# Patient Record
Sex: Female | Born: 1995 | Race: Black or African American | Hispanic: No | Marital: Single | Smoking: Never smoker
Health system: Southern US, Community
[De-identification: ages and names within clinical notes are randomized; demographics above are authoritative.]

## PROBLEM LIST (undated history)

## (undated) DIAGNOSIS — L309 Dermatitis, unspecified: Secondary | ICD-10-CM

## (undated) DIAGNOSIS — T7840XA Allergy, unspecified, initial encounter: Secondary | ICD-10-CM

## (undated) HISTORY — DX: Allergy, unspecified, initial encounter: T78.40XA

## (undated) HISTORY — PX: OTHER SURGICAL HISTORY: SHX169

## (undated) HISTORY — PX: BREAST SURGERY: SHX581

---
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
## 2015-09-21 LAB — OB RESULTS CONSOLE HEPATITIS B SURFACE ANTIGEN: Hepatitis B Surface Ag: NEGATIVE

## 2015-09-21 LAB — OB RESULTS CONSOLE ABO/RH: RH TYPE: POSITIVE

## 2015-09-21 LAB — OB RESULTS CONSOLE GC/CHLAMYDIA
Chlamydia: NEGATIVE
Gonorrhea: NEGATIVE

## 2015-09-21 LAB — OB RESULTS CONSOLE HIV ANTIBODY (ROUTINE TESTING): HIV: NONREACTIVE

## 2015-09-21 LAB — OB RESULTS CONSOLE RUBELLA ANTIBODY, IGM: RUBELLA: NON-IMMUNE/NOT IMMUNE

## 2015-09-21 LAB — OB RESULTS CONSOLE RPR: RPR: NONREACTIVE

## 2015-09-21 LAB — OB RESULTS CONSOLE ANTIBODY SCREEN: ANTIBODY SCREEN: NEGATIVE

## 2015-11-15 ENCOUNTER — Ambulatory Visit (INDEPENDENT_AMBULATORY_CARE_PROVIDER_SITE_OTHER): Payer: Medicaid Other | Admitting: Obstetrics & Gynecology

## 2015-11-15 ENCOUNTER — Encounter: Payer: Self-pay | Admitting: Obstetrics & Gynecology

## 2015-11-15 VITALS — BP 111/77 | HR 101 | Temp 98.4°F | Wt 156.4 lb

## 2015-11-15 DIAGNOSIS — Z3402 Encounter for supervision of normal first pregnancy, second trimester: Secondary | ICD-10-CM

## 2015-11-15 DIAGNOSIS — Z3490 Encounter for supervision of normal pregnancy, unspecified, unspecified trimester: Secondary | ICD-10-CM | POA: Insufficient documentation

## 2015-11-15 MED ORDER — PREPLUS 27-1 MG PO TABS: 1.0000 | ORAL_TABLET | Freq: Every day | ORAL | 13 refills | Status: DC

## 2015-11-15 NOTE — Patient Instructions (Signed)
Thank you for enrolling in MyChart. Please follow the instructions below to securely access your online medical record. MyChart allows you to send messages to your doctor, view your test results, manage appointments, and more.   How Do I Sign Up? 1. In your Internet browser, go to [employer] and enter [email or web address]. 2. Click on the Sign Up Now link in the Sign In box. You will see the New Member Sign Up page. 3. Enter your MyChart Access Code exactly as it appears below. You will not need to use this code after you've completed the sign-up process. If you do not sign up before the expiration date, you must request a new code.  MyChart Access Code: [email or web address] Expires: 01/14/2016  2:32 PM  4. Enter your Social Security Number ([email or web address]) and Date of Birth (mm/dd/yyyy) as indicated and click Submit. You will be taken to the next sign-up page. 5. Create a MyChart ID. This will be your MyChart login ID and cannot be changed, so think of one that is secure and easy to remember. 6. Create a MyChart password. You can change your password at any time. 7. Enter your Password Reset Question and Answer. This can be used at a later time if you forget your password.  8. Enter your e-mail address. You will receive e-mail notification when new information is available in MyChart. 9. Click Sign Up. You can now view your medical record.   Additional Information Remember, MyChart is NOT to be used for urgent needs. For medical emergencies, dial 911.    Second Trimester of Pregnancy The second trimester is from week 13 through week 28, months 4 through 6. The second trimester is often a time when you feel your best. Your body has also adjusted to being pregnant, and you begin to feel better physically. Usually, morning sickness has lessened or quit completely, you may have more energy, and you may have an increase in appetite. The second trimester is also a time when the fetus is  growing rapidly. At the end of the sixth month, the fetus is about 9 inches long and weighs about 1 pounds. You will likely begin to feel the baby move (quickening) between 18 and 20 weeks of the pregnancy. BODY CHANGES Your body goes through many changes during pregnancy. The changes vary from woman to woman.   Your weight will continue to increase. You will notice your lower abdomen bulging out.  You may begin to get stretch marks on your hips, abdomen, and breasts.  You may develop headaches that can be relieved by medicines approved by your health care provider.  You may urinate more often because the fetus is pressing on your bladder.  You may develop or continue to have heartburn as a result of your pregnancy.  You may develop constipation because certain hormones are causing the muscles that push waste through your intestines to slow down.  You may develop hemorrhoids or swollen, bulging veins (varicose veins).  You may have back pain because of the weight gain and pregnancy hormones relaxing your joints between the bones in your pelvis and as a result of a shift in weight and the muscles that support your balance.  Your breasts will continue to grow and be tender.  Your gums may bleed and may be sensitive to brushing and flossing.  Dark spots or blotches (chloasma, mask of pregnancy) may develop on your face. This will likely fade after the baby is born.  A dark line from your  belly button to the pubic area (linea nigra) may appear. This will likely fade after the baby is born.  You may have changes in your hair. These can include thickening of your hair, rapid growth, and changes in texture. Some women also have hair loss during or after pregnancy, or hair that feels dry or thin. Your hair will most likely return to normal after your baby is born. WHAT TO EXPECT AT YOUR PRENATAL VISITS During a routine prenatal visit:  You will be weighed to make sure you and the fetus are  growing normally.  Your blood pressure will be taken.  Your abdomen will be measured to track your baby's growth.  The fetal heartbeat will be listened to.  Any test results from the previous visit will be discussed. Your health care provider may ask you:  How you are feeling.  If you are feeling the baby move.  If you have had any abnormal symptoms, such as leaking fluid, bleeding, severe headaches, or abdominal cramping.  If you are using any tobacco products, including cigarettes, chewing tobacco, and electronic cigarettes.  If you have any questions. Other tests that may be performed during your second trimester include:  Blood tests that check for:  Low iron levels (anemia).  Gestational diabetes (between 24 and 28 weeks).  Rh antibodies.  Urine tests to check for infections, diabetes, or protein in the urine.  An ultrasound to confirm the proper growth and development of the baby.  An amniocentesis to check for possible genetic problems.  Fetal screens for spina bifida and Down syndrome.  HIV (human immunodeficiency virus) testing. Routine prenatal testing includes screening for HIV, unless you choose not to have this test. HOME CARE INSTRUCTIONS   Avoid all smoking, herbs, alcohol, and unprescribed drugs. These chemicals affect the formation and growth of the baby.  Do not use any tobacco products, including cigarettes, chewing tobacco, and electronic cigarettes. If you need help quitting, ask your health care provider. You may receive counseling support and other resources to help you quit.  Follow your health care provider's instructions regarding medicine use. There are medicines that are either safe or unsafe to take during pregnancy.  Exercise only as directed by your health care provider. Experiencing uterine cramps is a good sign to stop exercising.  Continue to eat regular, healthy meals.  Wear a good support bra for breast tenderness.  Do not use  hot tubs, steam rooms, or saunas.  Wear your seat belt at all times when driving.  Avoid raw meat, uncooked cheese, cat litter boxes, and soil used by cats. These carry germs that can cause birth defects in the baby.  Take your prenatal vitamins.  Take 1500-2000 mg of calcium daily starting at the 20th week of pregnancy until you deliver your baby.  Try taking a stool softener (if your health care provider approves) if you develop constipation. Eat more high-fiber foods, such as fresh vegetables or fruit and whole grains. Drink plenty of fluids to keep your urine clear or pale yellow.  Take warm sitz baths to soothe any pain or discomfort caused by hemorrhoids. Use hemorrhoid cream if your health care provider approves.  If you develop varicose veins, wear support hose. Elevate your feet for 15 minutes, 3-4 times a day. Limit salt in your diet.  Avoid heavy lifting, wear low heel shoes, and practice good posture.  Rest with your legs elevated if you have leg cramps or low back pain.  Visit your dentist if  you have not gone yet during your pregnancy. Use a soft toothbrush to brush your teeth and be gentle when you floss.  A sexual relationship may be continued unless your health care provider directs you otherwise.  Continue to go to all your prenatal visits as directed by your health care provider. SEEK MEDICAL CARE IF:   You have dizziness.  You have mild pelvic cramps, pelvic pressure, or nagging pain in the abdominal area.  You have persistent nausea, vomiting, or diarrhea.  You have a bad smelling vaginal discharge.  You have pain with urination. SEEK IMMEDIATE MEDICAL CARE IF:   You have a fever.  You are leaking fluid from your vagina.  You have spotting or bleeding from your vagina.  You have severe abdominal cramping or pain.  You have rapid weight gain or loss.  You have shortness of breath with chest pain.  You notice sudden or extreme swelling of your  face, hands, ankles, feet, or legs.  You have not felt your baby move in over an hour.  You have severe headaches that do not go away with medicine.  You have vision changes.   This information is not intended to replace advice given to you by your health care provider. Make sure you discuss any questions you have with your health care provider.   Document Released: 01/07/2001 Document Revised: 02/03/2014 Document Reviewed: 03/16/2012 Elsevier Interactive Patient Education [employer]2016 Elsevier Inc.

## 2015-11-15 NOTE — Addendum Note (Signed)
Addended by: Jaynie CollinsANYANWU, UGONNA A on: 11/15/2015 04:17 PM   Modules accepted: Orders

## 2015-11-15 NOTE — Progress Notes (Signed)
   PRENATAL VISIT NOTE  Subjective:  [name] is a 20 y.o. G1P0 at 273w6d being seen today for transfer of prenatal care from [facility] due to insurance issues. No issues so far this pregnancy.  She is currently monitored for the following issues for this low-risk pregnancy and has Supervision of normal pregnancy on her problem list.  Patient reports heartburn and headache.  Contractions: Not present. Vag. Bleeding: None.   . Denies leaking of fluid.   The following portions of the patient's history were reviewed and updated as appropriate: allergies, current medications, past family history, past medical history, past social history, past surgical history and problem list. Problem list updated.  Objective:   Vitals:   11/15/15 1422  BP: 111/77  Pulse: (!) 101  Temp: 98.4 F (36.9 C)  Weight: 156 lb 6.4 oz (70.9 kg)    Fetal Status: Fetal Heart Rate (bpm): 150 Fundal Height: 16 cm       General:  Alert, oriented and cooperative. Patient is in no acute distress.  Skin: Skin is warm and dry. No rash noted.   Cardiovascular: Normal heart rate noted  Respiratory: Normal respiratory effort, no problems with respiration noted  Abdomen: Soft, gravid, appropriate for gestational age. Pain/Pressure: Absent     Pelvic:  Cervical exam deferred        Extremities: Normal range of motion.  Edema: None  Mental Status: Normal mood and affect. Normal behavior. Normal judgment and thought content.   Assessment and Plan:  Pregnancy: G1P0 at 313w6d  Encounter for supervision of normal first pregnancy in second trimester Quad screen done today. Anatomy scan next visit The nature of [facility] with multiple MDs and other Advanced Practice Providers was explained to patient; also emphasized that residents, students are part of our team. Will get records from [facility] with initial labs and pap smear - US OB Comp + 14 Wk; Future - AFP, Quad Screen OTC heartburn and  headache remedies recommended; advised to call for worsening symptoms. No other complaints or concerns.  Routine obstetric precautions reviewed. Please refer to After Visit Summary for other counseling recommendations.  Return in about 4 weeks (around 12/13/2015) for OB Visit and anatomy scan.  Tereso NewcomerUgonna A Kenlei Safi, MD

## 2015-11-17 LAB — AFP, QUAD SCREEN
DIA Mom Value: 0.88
DIA Value (EIA): 157.73 pg/mL
DSR (BY AGE) 1 IN: 1141
DSR (Second Trimester) 1 IN: 3732
Gestational Age: 15.9 WEEKS
MSAFP MOM: 0.63
MSAFP: 21.9 ng/mL
MSHCG MOM: 0.64
MSHCG: 25403 m[IU]/mL
Maternal Age At EDD: 21.4 YEARS
OSB RISK: 10000
Test Results:: NEGATIVE
UE3 VALUE: 0.39 ng/mL
WEIGHT: 156 [lb_av]
uE3 Mom: 0.5

## 2015-11-22 ENCOUNTER — Encounter: Payer: Self-pay | Admitting: Certified Nurse Midwife

## 2015-11-26 ENCOUNTER — Other Ambulatory Visit: Payer: Self-pay | Admitting: Certified Nurse Midwife

## 2015-11-26 DIAGNOSIS — B373 Candidiasis of vulva and vagina: Secondary | ICD-10-CM

## 2015-11-26 DIAGNOSIS — B3731 Acute candidiasis of vulva and vagina: Secondary | ICD-10-CM

## 2015-11-26 MED ORDER — FLUCONAZOLE 150 MG PO TABS: 150.0000 mg | ORAL_TABLET | Freq: Once | ORAL | 0 refills | Status: AC

## 2015-12-13 ENCOUNTER — Ambulatory Visit (INDEPENDENT_AMBULATORY_CARE_PROVIDER_SITE_OTHER): Payer: Medicaid Other | Admitting: Certified Nurse Midwife

## 2015-12-13 ENCOUNTER — Ambulatory Visit: Payer: Medicaid Other

## 2015-12-13 DIAGNOSIS — Z3402 Encounter for supervision of normal first pregnancy, second trimester: Secondary | ICD-10-CM

## 2015-12-13 NOTE — Progress Notes (Signed)
Subjective:    [name] is a 20 y.o. female being seen today for her obstetrical visit. She is at 7162w6d gestation. Patient reports: no complaints . Fetal movement: normal.  Problem List Items Addressed This Visit      Other   Supervision of normal pregnancy     Patient Active Problem List   Diagnosis Date Noted  . Supervision of normal pregnancy 11/15/2015   Objective:    BP 102/70   Pulse 91   Wt 158 lb (71.7 kg)   LMP 07/27/2015  FHT: 136 BPM  Uterine Size: 20 cm and size equals dates     Assessment:    Pregnancy @ 762w6d    Doing well  Plan:   Had fetal anatomy scan today in office.   OBGCT: discussed.  Labs, problem list reviewed and updated 2 hr GTT planned Follow up in 4 weeks.

## 2016-01-24 ENCOUNTER — Ambulatory Visit (INDEPENDENT_AMBULATORY_CARE_PROVIDER_SITE_OTHER): Payer: Medicaid Other | Admitting: Family Medicine

## 2016-01-24 VITALS — BP 120/70 | HR 94 | Wt 165.0 lb

## 2016-01-24 DIAGNOSIS — O9989 Other specified diseases and conditions complicating pregnancy, childbirth and the puerperium: Secondary | ICD-10-CM

## 2016-01-24 DIAGNOSIS — Z283 Underimmunization status: Secondary | ICD-10-CM

## 2016-01-24 DIAGNOSIS — Z3402 Encounter for supervision of normal first pregnancy, second trimester: Secondary | ICD-10-CM

## 2016-01-24 DIAGNOSIS — Z2839 Other underimmunization status: Secondary | ICD-10-CM | POA: Insufficient documentation

## 2016-01-24 DIAGNOSIS — O09899 Supervision of other high risk pregnancies, unspecified trimester: Secondary | ICD-10-CM | POA: Insufficient documentation

## 2016-01-24 NOTE — Progress Notes (Signed)
   PRENATAL VISIT NOTE  Subjective:  [name] is a 20 y.o. G1P0 at 6264w6d being seen today for ongoing prenatal care.  She is currently monitored for the following issues for this low-risk pregnancy and has Supervision of normal pregnancy and Rubella non-immune status, antepartum on her problem list.  Patient reports no complaints.  Contractions: Not present. Vag. Bleeding: None.  Movement: Present. Denies leaking of fluid.   The following portions of the patient's history were reviewed and updated as appropriate: allergies, current medications, past family history, past medical history, past social history, past surgical history and problem list. Problem list updated.  Objective:   Vitals:   01/24/16 1020  BP: 120/70  Pulse: 94  Weight: 165 lb (74.8 kg)    Fetal Status: Fetal Heart Rate (bpm): 135 Fundal Height: 25 cm Movement: Present     General:  Alert, oriented and cooperative. Patient is in no acute distress.  Skin: Skin is warm and dry. No rash noted.   Cardiovascular: Normal heart rate noted  Respiratory: Normal respiratory effort, no problems with respiration noted  Abdomen: Soft, gravid, appropriate for gestational age. Pain/Pressure: Absent     Pelvic:  Cervical exam deferred        Extremities: Normal range of motion.  Edema: Trace  Mental Status: Normal mood and affect. Normal behavior. Normal judgment and thought content.   Assessment and Plan:  Pregnancy: G1P0 at 4464w6d  1. Encounter for supervision of normal first pregnancy in second trimester FHT and FH normal  2. Rubella non-immune status, antepartum   Preterm labor symptoms and general obstetric precautions including but not limited to vaginal bleeding, contractions, leaking of fluid and fetal movement were reviewed in detail with the patient. Please refer to After Visit Summary for other counseling recommendations.  Return in about 3 weeks (around 02/14/2016) for OB f/u, 2 hr GTT.   Levie HeritageJacob J Stinson,  DO

## 2016-01-24 NOTE — Patient Instructions (Signed)

## 2016-01-28 NOTE — L&D Delivery Note (Signed)
Patient is 21 y.o. G1P0 [redacted]w[redacted]d admitted IOL for nonreassuring NST   Delivery Note At 11:10 AM a viable female was delivered via Vaginal, Spontaneous Delivery (Presentation: LOA). Anterior shoulder delivered easily. APGAR: 9, 9; weight  pending Placenta status: delivered spontaneously, intact  Cord: 3 vessel  with the following complications: none  Anesthesia:  Epidural Episiotomy: None Lacerations:  None, periurethral abrasions Suture Repair: None Est. Blood Loss (mL): 100  Mom to postpartum.  Baby to Couplet care / Skin to Skin.  Durenda Hurt 05/09/2016, 11:56 AM  Patient is a G1 at [redacted]w[redacted]d who was admitted for IOL due to nonreassuring NST, uncomplicated prenatal course.  She progressed with augmentation via cyto/FB/Pit.  I was gloved and present for delivery in its entirety.  Second stage of labor progressed to SVD.  Mild decels during second stage noted.  Complications: none  Lacerations: none  EBL: 100cc  Cam Hai, CNM 7:36 PM  05/10/2016

## 2016-02-14 ENCOUNTER — Encounter: Payer: Self-pay | Admitting: Obstetrics & Gynecology

## 2016-02-20 ENCOUNTER — Encounter: Payer: Self-pay | Admitting: Obstetrics & Gynecology

## 2016-02-21 ENCOUNTER — Ambulatory Visit (INDEPENDENT_AMBULATORY_CARE_PROVIDER_SITE_OTHER): Payer: Medicaid Other | Admitting: Obstetrics & Gynecology

## 2016-02-21 VITALS — BP 124/73 | HR 90 | Wt 169.0 lb

## 2016-02-21 DIAGNOSIS — Z3A29 29 weeks gestation of pregnancy: Secondary | ICD-10-CM

## 2016-02-21 DIAGNOSIS — Z3403 Encounter for supervision of normal first pregnancy, third trimester: Secondary | ICD-10-CM

## 2016-02-21 MED ORDER — TETANUS-DIPHTH-ACELL PERTUSSIS 5-2.5-18.5 LF-MCG/0.5 IM SUSP: 0.5000 mL | Freq: Once | INTRAMUSCULAR | Status: DC

## 2016-02-21 NOTE — Patient Instructions (Signed)
Third Trimester of Pregnancy The third trimester is from week 29 through week 40 (months 7 through 9). The third trimester is a time when the unborn baby (fetus) is growing rapidly. At the end of the ninth month, the fetus is about 20 inches in length and weighs 6-10 pounds. Body changes during your third trimester Your body goes through many changes during pregnancy. The changes vary from woman to woman. During the third trimester:  Your weight will continue to increase. You can expect to gain 25-35 pounds (11-16 kg) by the end of the pregnancy.  You may begin to get stretch marks on your hips, abdomen, and breasts.  You may urinate more often because the fetus is moving lower into your pelvis and pressing on your bladder.  You may develop or continue to have heartburn. This is caused by increased hormones that slow down muscles in the digestive tract.  You may develop or continue to have constipation because increased hormones slow digestion and cause the muscles that push waste through your intestines to relax.  You may develop hemorrhoids. These are swollen veins (varicose veins) in the rectum that can itch or be painful.  You may develop swollen, bulging veins (varicose veins) in your legs.  You may have increased body aches in the pelvis, back, or thighs. This is due to weight gain and increased hormones that are relaxing your joints.  You may have changes in your hair. These can include thickening of your hair, rapid growth, and changes in texture. Some women also have hair loss during or after pregnancy, or hair that feels dry or thin. Your hair will most likely return to normal after your baby is born.  Your breasts will continue to grow and they will continue to become tender. A yellow fluid (colostrum) may leak from your breasts. This is the first milk you are producing for your baby.  Your belly button may stick out.  You may notice more swelling in your hands, face, or  ankles.  You may have increased tingling or numbness in your hands, arms, and legs. The skin on your belly may also feel numb.  You may feel short of breath because of your expanding uterus.  You may have more problems sleeping. This can be caused by the size of your belly, increased need to urinate, and an increase in your body's metabolism.  You may notice the fetus "dropping," or moving lower in your abdomen.  You may have increased vaginal discharge.  Your cervix becomes thin and soft (effaced) near your due date. What to expect at prenatal visits You will have prenatal exams every 2 weeks until week 36. Then you will have weekly prenatal exams. During a routine prenatal visit:  You will be weighed to make sure you and the fetus are growing normally.  Your blood pressure will be taken.  Your abdomen will be measured to track your baby's growth.  The fetal heartbeat will be listened to.  Any test results from the previous visit will be discussed.  You may have a cervical check near your due date to see if you have effaced. At around 36 weeks, your health care provider will check your cervix. At the same time, your health care provider will also perform a test on the secretions of the vaginal tissue. This test is to determine if a type of bacteria, Group B streptococcus, is present. Your health care provider will explain this further. Your health care provider may ask you:    What your birth plan is.  How you are feeling.  If you are feeling the baby move.  If you have had any abnormal symptoms, such as leaking fluid, bleeding, severe headaches, or abdominal cramping.  If you are using any tobacco products, including cigarettes, chewing tobacco, and electronic cigarettes.  If you have any questions. Other tests or screenings that may be performed during your third trimester include:  Blood tests that check for low iron levels (anemia).  Fetal testing to check the health,  activity level, and growth of the fetus. Testing is done if you have certain medical conditions or if there are problems during the pregnancy.  Nonstress test (NST). This test checks the health of your baby to make sure there are no signs of problems, such as the baby not getting enough oxygen. During this test, a belt is placed around your belly. The baby is made to move, and its heart rate is monitored during movement. What is false labor? False labor is a condition in which you feel small, irregular tightenings of the muscles in the womb (contractions) that eventually go away. These are called Braxton Hicks contractions. Contractions may last for hours, days, or even weeks before true labor sets in. If contractions come at regular intervals, become more frequent, increase in intensity, or become painful, you should see your health care provider. What are the signs of labor?  Abdominal cramps.  Regular contractions that start at 10 minutes apart and become stronger and more frequent with time.  Contractions that start on the top of the uterus and spread down to the lower abdomen and back.  Increased pelvic pressure and dull back pain.  A watery or bloody mucus discharge that comes from the vagina.  Leaking of amniotic fluid. This is also known as your "water breaking." It could be a slow trickle or a gush. Let your doctor know if it has a color or strange odor. If you have any of these signs, call your health care provider right away, even if it is before your due date. Follow these instructions at home: Eating and drinking  Continue to eat regular, healthy meals.  Do not eat:  Raw meat or meat spreads.  Unpasteurized milk or cheese.  Unpasteurized juice.  Store-made salad.  Refrigerated smoked seafood.  Hot dogs or deli meat, unless they are piping hot.  More than 6 ounces of albacore tuna a week.  Shark, swordfish, king mackerel, or tile fish.  Store-made salads.  Raw  sprouts, such as mung bean or alfalfa sprouts.  Take prenatal vitamins as told by your health care provider.  Take 1000 mg of calcium daily as told by your health care provider.  If you develop constipation:  Take over-the-counter or prescription medicines.  Drink enough fluid to keep your urine clear or pale yellow.  Eat foods that are high in fiber, such as fresh fruits and vegetables, whole grains, and beans.  Limit foods that are high in fat and processed sugars, such as fried and sweet foods. Activity  Exercise only as directed by your health care provider. Healthy pregnant women should aim for 2 hours and 30 minutes of moderate exercise per week. If you experience any pain or discomfort while exercising, stop.  Avoid heavy lifting.  Do not exercise in extreme heat or humidity, or at high altitudes.  Wear low-heel, comfortable shoes.  Practice good posture.  Do not travel far distances unless it is absolutely necessary and only with the approval   of your health care provider.  Wear your seat belt at all times while in a car, on a bus, or on a plane.  Take frequent breaks and rest with your legs elevated if you have leg cramps or low back pain.  Do not use hot tubs, steam rooms, or saunas.  You may continue to have sex unless your health care provider tells you otherwise. Lifestyle  Do not use any products that contain nicotine or tobacco, such as cigarettes and e-cigarettes. If you need help quitting, ask your health care provider.  Do not drink alcohol.  Do not use any medicinal herbs or unprescribed drugs. These chemicals affect the formation and growth of the baby.  If you develop varicose veins:  Wear support pantyhose or compression stockings as told by your healthcare provider.  Elevate your feet for 15 minutes, 3-4 times a day.  Wear a supportive maternity bra to help with breast tenderness. General instructions  Take over-the-counter and prescription  medicines only as told by your health care provider. There are medicines that are either safe or unsafe to take during pregnancy.  Take warm sitz baths to soothe any pain or discomfort caused by hemorrhoids. Use hemorrhoid cream or witch hazel if your health care provider approves.  Avoid cat litter boxes and soil used by cats. These carry germs that can cause birth defects in the baby. If you have a cat, ask someone to clean the litter box for you.  To prepare for the arrival of your baby:  Take prenatal classes to understand, practice, and ask questions about the labor and delivery.  Make a trial run to the hospital.  Visit the hospital and tour the maternity area.  Arrange for maternity or paternity leave through employers.  Arrange for family and friends to take care of pets while you are in the hospital.  Purchase a rear-facing car seat and make sure you know how to install it in your car.  Pack your hospital bag.  Prepare the baby's nursery. Make sure to remove all pillows and stuffed animals from the baby's crib to prevent suffocation.  Visit your dentist if you have not gone during your pregnancy. Use a soft toothbrush to brush your teeth and be gentle when you floss.  Keep all prenatal follow-up visits as told by your health care provider. This is important. Contact a health care provider if:  You are unsure if you are in labor or if your water has broken.  You become dizzy.  You have mild pelvic cramps, pelvic pressure, or nagging pain in your abdominal area.  You have lower back pain.  You have persistent nausea, vomiting, or diarrhea.  You have an unusual or bad smelling vaginal discharge.  You have pain when you urinate. Get help right away if:  You have a fever.  You are leaking fluid from your vagina.  You have spotting or bleeding from your vagina.  You have severe abdominal pain or cramping.  You have rapid weight loss or weight gain.  You have  shortness of breath with chest pain.  You notice sudden or extreme swelling of your face, hands, ankles, feet, or legs.  Your baby makes fewer than 10 movements in 2 hours.  You have severe headaches that do not go away with medicine.  You have vision changes. Summary  The third trimester is from week 29 through week 40, months 7 through 9. The third trimester is a time when the unborn baby (fetus)   is growing rapidly.  During the third trimester, your discomfort may increase as you and your baby continue to gain weight. You may have abdominal, leg, and back pain, sleeping problems, and an increased need to urinate.  During the third trimester your breasts will keep growing and they will continue to become tender. A yellow fluid (colostrum) may leak from your breasts. This is the first milk you are producing for your baby.  False labor is a condition in which you feel small, irregular tightenings of the muscles in the womb (contractions) that eventually go away. These are called Braxton Hicks contractions. Contractions may last for hours, days, or even weeks before true labor sets in.  Signs of labor can include: abdominal cramps; regular contractions that start at 10 minutes apart and become stronger and more frequent with time; watery or bloody mucus discharge that comes from the vagina; increased pelvic pressure and dull back pain; and leaking of amniotic fluid. This information is not intended to replace advice given to you by your health care provider. Make sure you discuss any questions you have with your health care provider. Document Released: 01/07/2001 Document Revised: 06/21/2015 Document Reviewed: 03/16/2012 Elsevier Interactive Patient Education  2017 [employer]  

## 2016-02-21 NOTE — Progress Notes (Signed)
Pt declined flu shot. Pt declined tdap.

## 2016-02-21 NOTE — Progress Notes (Signed)
   PRENATAL VISIT NOTE  Subjective:  [name] is a 21 y.o. G1P0 at 5925w6d being seen today for ongoing prenatal care.  She is currently monitored for the following issues for this low-risk pregnancy and has Supervision of normal pregnancy and Rubella non-immune status, antepartum on her problem list.  Patient reports no complaints.  Contractions: Not present. Vag. Bleeding: None.  Movement: Present. Denies leaking of fluid.   The following portions of the patient's history were reviewed and updated as appropriate: allergies, current medications, past family history, past medical history, past social history, past surgical history and problem list. Problem list updated.  Objective:   Vitals:   02/21/16 0844  BP: 124/73  Pulse: 90  Weight: 169 lb (76.7 kg)    Fetal Status: Fetal Heart Rate (bpm): 137   Movement: Present     General:  Alert, oriented and cooperative. Patient is in no acute distress.  Skin: Skin is warm and dry. No rash noted.   Cardiovascular: Normal heart rate noted  Respiratory: Normal respiratory effort, no problems with respiration noted  Abdomen: Soft, gravid, appropriate for gestational age. Pain/Pressure: Absent     Pelvic:  Cervical exam deferred        Extremities: Normal range of motion.  Edema: None  Mental Status: Normal mood and affect. Normal behavior. Normal judgment and thought content.   Assessment and Plan:  Pregnancy: G1P0 at 7425w6d  1. [redacted] weeks gestation of pregnancy  - Glucose Tolerance, 2 Hours w/1 Hour  2. Encounter for supervision of normal first pregnancy in third trimester Need to address Tdap and flu vaccine next visit. Pt declined but, was not able to discuss with provider prior to leaving ofc.   Preterm labor symptoms and general obstetric precautions including but not limited to vaginal bleeding, contractions, leaking of fluid and fetal movement were reviewed in detail with the patient. Please refer to After Visit Summary for  other counseling recommendations.  Return in about 2 weeks (around 03/06/2016).   Willodean Rosenthalarolyn Harraway-Smith, MD

## 2016-02-22 LAB — GLUCOSE TOLERANCE, 2 HOURS W/ 1HR
GLUCOSE, FASTING: 68 mg/dL (ref 65–91)
Glucose, 1 hour: 91 mg/dL (ref 65–179)
Glucose, 2 hour: 56 mg/dL — ABNORMAL LOW (ref 65–152)

## 2016-02-23 ENCOUNTER — Emergency Department (HOSPITAL_BASED_OUTPATIENT_CLINIC_OR_DEPARTMENT_OTHER)
Admission: EM | Admit: 2016-02-23 | Discharge: 2016-02-23 | Disposition: A | Discharge status: Home / Self Care | Payer: Medicaid Other | Attending: Emergency Medicine | Admitting: Emergency Medicine

## 2016-02-23 ENCOUNTER — Encounter (HOSPITAL_BASED_OUTPATIENT_CLINIC_OR_DEPARTMENT_OTHER): Payer: Self-pay

## 2016-02-23 DIAGNOSIS — O26893 Other specified pregnancy related conditions, third trimester: Secondary | ICD-10-CM | POA: Diagnosis present

## 2016-02-23 DIAGNOSIS — Z3A30 30 weeks gestation of pregnancy: Secondary | ICD-10-CM | POA: Insufficient documentation

## 2016-02-23 DIAGNOSIS — Z79899 Other long term (current) drug therapy: Secondary | ICD-10-CM | POA: Diagnosis not present

## 2016-02-23 DIAGNOSIS — O2313 Infections of bladder in pregnancy, third trimester: Secondary | ICD-10-CM | POA: Insufficient documentation

## 2016-02-23 DIAGNOSIS — N3000 Acute cystitis without hematuria: Secondary | ICD-10-CM

## 2016-02-23 DIAGNOSIS — Z3493 Encounter for supervision of normal pregnancy, unspecified, third trimester: Secondary | ICD-10-CM

## 2016-02-23 LAB — URINALYSIS, ROUTINE W REFLEX MICROSCOPIC
BILIRUBIN URINE: NEGATIVE
GLUCOSE, UA: NEGATIVE mg/dL
HGB URINE DIPSTICK: NEGATIVE
KETONES UR: NEGATIVE mg/dL
Nitrite: NEGATIVE
PROTEIN: NEGATIVE mg/dL
Specific Gravity, Urine: 1.017 (ref 1.005–1.030)
pH: 7.5 (ref 5.0–8.0)

## 2016-02-23 LAB — URINALYSIS, MICROSCOPIC (REFLEX)

## 2016-02-23 MED ORDER — AMOXICILLIN-POT CLAVULANATE 875-125 MG PO TABS
1.0000 | ORAL_TABLET | Freq: Once | ORAL | Status: AC
  Administered 2016-02-23: 1 via ORAL

## 2016-02-23 MED ORDER — AMOXICILLIN-POT CLAVULANATE 875-125 MG PO TABS
ORAL_TABLET | ORAL | Status: AC
Start: 2016-02-23 — End: 2016-02-23
  Administered 2016-02-23: 1 via ORAL
  Filled 2016-02-23: qty 1

## 2016-02-23 MED ORDER — AMOXICILLIN-POT CLAVULANATE 500-125 MG PO TABS
1.0000 | ORAL_TABLET | Freq: Once | ORAL | Status: DC
  Filled 2016-02-23: qty 1

## 2016-02-23 MED ORDER — ONDANSETRON 4 MG PO TBDP: 4.0000 mg | ORAL_TABLET | Freq: Three times a day (TID) | ORAL | 0 refills | Status: DC | PRN

## 2016-02-23 MED ORDER — AMOXICILLIN-POT CLAVULANATE 500-125 MG PO TABS: 1.0000 | ORAL_TABLET | Freq: Two times a day (BID) | ORAL | 0 refills | Status: DC

## 2016-02-23 NOTE — Discharge Instructions (Signed)
Start the antibiotics prescribed. [facility] symptoms get worse[facility]

## 2016-02-23 NOTE — ED Notes (Signed)
Vital signs stable. 

## 2016-02-23 NOTE — ED Notes (Signed)
Erin MessierKathy RN [facility] called and states no signs of labor. Fetal HR remains consistent in 140s. Pt can be taken off fetal monitor.

## 2016-02-23 NOTE — ED Notes (Signed)
Pt tolerating PO fluids well

## 2016-02-23 NOTE — ED Provider Notes (Signed)
[facility]   CSN: [id number] Arrival date & time: 02/23/16  0008     History   Chief Complaint Chief Complaint  Patient presents with  . Flank Pain    HPI [name] is a 21 y.o. female.  HPI Pt comes in with cc of back pain. G1P0, pt is in her 3rd trimester. She reports that she has some dysuria, urinary urgency for few days, and developed some back pain today, so she was instructed to come to the ER. Pt has no periodic abd contractions. She has no n/v/f/c. No vaginal discharge or bleeding.   Past Medical History:  Diagnosis Date  . Eczema     Patient Active Problem List   Diagnosis Date Noted  . Rubella non-immune status, antepartum 01/24/2016  . Supervision of normal pregnancy 11/15/2015    Past Surgical History:  Procedure Laterality Date  . ovary removed      OB History    Gravida Para Term Preterm AB Living   1             SAB TAB Ectopic Multiple Live Births                   Home Medications    Prior to Admission medications   Medication Sig Start Date End Date Taking? Authorizing Provider  amoxicillin-clavulanate (AUGMENTIN) 500-125 MG tablet Take 1 tablet (500 mg total) by mouth 2 (two) times daily. 02/23/16   Derwood KaplanAnkit Meganne Rita, MD  ondansetron (ZOFRAN-ODT) 4 MG disintegrating tablet Take 1 tablet (4 mg total) by mouth every 8 (eight) hours as needed for nausea. 02/23/16   Derwood KaplanAnkit Deanda Ruddell, MD  Prenatal Vit-Fe Fumarate-FA (PREPLUS) 27-1 MG TABS Take 1 tablet by mouth daily. Patient not taking: Reported on 02/21/2016 11/15/15   Tereso NewcomerUgonna A Anyanwu, MD    Family History Family History  Problem Relation Age of Onset  . Aneurysm Mother   . Fibroids Mother     Social History Social History  Substance Use Topics  . Smoking status: Never Smoker  . Smokeless tobacco: Never Used  . Alcohol use No     Allergies   Patient has no active allergies.   Review of Systems Review of Systems  ROS 10 Systems reviewed and are  negative for acute change except as noted in the HPI.     Physical Exam Updated Vital Signs BP 123/84   Pulse 91   Temp 98 F (36.7 C) (Oral)   Resp 16   Ht 5\' 4"  (1.626 m)   Wt 169 lb (76.7 kg)   LMP 07/27/2015   SpO2 100%   BMI 29.01 kg/m   Physical Exam  Constitutional: She is oriented to person, place, and time. She appears well-developed and well-nourished.  HENT:  Head: Normocephalic and atraumatic.  Eyes: EOM are normal. Pupils are equal, round, and reactive to light.  Neck: Neck supple.  Cardiovascular: Normal rate, regular rhythm and normal heart sounds.   No murmur heard. Pulmonary/Chest: Effort normal. No respiratory distress.  Abdominal: Soft. There is no tenderness. There is no rebound and no guarding.  Neurological: She is alert and oriented to person, place, and time.  Skin: Skin is warm and dry.  Nursing note and vitals reviewed.    ED Treatments / Results  Labs (all labs ordered are listed, but only abnormal results are displayed) Labs Reviewed  URINALYSIS, ROUTINE W REFLEX MICROSCOPIC - Abnormal; Notable for the following:       Result  Value   APPearance CLOUDY (*)    Leukocytes, UA LARGE (*)    All other components within normal limits  URINALYSIS, MICROSCOPIC (REFLEX) - Abnormal; Notable for the following:    Bacteria, UA MANY (*)    Squamous Epithelial / LPF 6-30 (*)    All other components within normal limits  URINE CULTURE    EKG  EKG Interpretation None       Radiology No results found.  Procedures Procedures (including critical care time)  Medications Ordered in ED Medications  amoxicillin-clavulanate (AUGMENTIN) 875-125 MG per tablet 1 tablet (1 tablet Oral Given 02/23/16 0215)     Initial Impression / Assessment and Plan / ED Course  I have reviewed the triage vital signs and the nursing notes.  Pertinent labs & imaging results that were available during my care of the patient were reviewed by me and considered in my  medical decision making (see chart for details).     Pt is having cystitis most likely. No systemic signs. [facility] consulted after pt was placed on tocometer, and the FHT are reassuring in the 140s. Pt has no labor contractions. We will give augmentin over keflex given increased spectrum that it covers and also as she has no risk for resistance. Strict ER return precautions have been discussed, and patient is agreeing with the plan and is comfortable with the workup done and the recommendations from the ER.   Final Clinical Impressions(s) / ED Diagnoses   Final diagnoses:  Acute cystitis without hematuria  Third trimester pregnancy    New Prescriptions New Prescriptions   AMOXICILLIN-CLAVULANATE (AUGMENTIN) 500-125 MG TABLET    Take 1 tablet (500 mg total) by mouth 2 (two) times daily.   ONDANSETRON (ZOFRAN-ODT) 4 MG DISINTEGRATING TABLET    Take 1 tablet (4 mg total) by mouth every 8 (eight) hours as needed for nausea.     Derwood Kaplan, MD 02/23/16 [phone]

## 2016-02-23 NOTE — ED Triage Notes (Signed)
Pt c/o right side flank pain into her right lower abdomen with urinary frequency.  She is [redacted] weeks pregnant, G1P0, denies vaginal discharge or bleeding, states she feels some pressure into her bottom and is uncomfortable sitting in triage.

## 2016-02-23 NOTE — ED Notes (Signed)
Family at bedside. 

## 2016-02-23 NOTE — ED Notes (Signed)
Olegario MessierKathy RN from [facility] returned call and states 1 small contraction during 20 min monitoring. No decelerations noted.

## 2016-02-23 NOTE — ED Notes (Signed)
Pt. Reports she has had burning with urination and R flank pain with R back pain.  Pt. Reports no vomiting or diarrhea.  Pt. Reports 1st pregnancy and no contractions.

## 2016-02-23 NOTE — Progress Notes (Signed)
FHT 140bpm, good variability, no accelerations, no decelerations. One contraction in 20 minute tracing. Updated Caryl AspKellie RN of FHT and contractons. Requested adjustment of fetal monitors, currently not tracing.

## 2016-02-23 NOTE — ED Notes (Signed)
Pt on fetal monitor. [facility] nurse contacted.

## 2016-02-24 LAB — URINE CULTURE

## 2016-02-25 ENCOUNTER — Telehealth: Payer: Self-pay | Admitting: Emergency Medicine

## 2016-02-25 NOTE — Telephone Encounter (Signed)
Post ED Visit - Positive Culture Follow-up  Culture report reviewed by antimicrobial stewardship pharmacist:  []  Enzo BiNathan Batchelder, Pharm.D. []  Celedonio MiyamotoJeremy Frens, Pharm.D., BCPS []  Garvin FilaMike Maccia, Pharm.D. []  Georgina PillionElizabeth Martin, Pharm.D., BCPS []  [place]h Pham, [place].D., BCPS, AAHIVP []  Estella HuskMichelle Turner, Pharm.D., BCPS, AAHIVP []  Tennis Mustassie Stewart, Pharm.D. []  Sherle Poeob Vincent, [address] Rainbow BoulevardPharm[address]Carylon Perches. Maggie Shuda PharmD  Positive urine culture Treated with amoxicillin, organism sensitive to the same and no further patient follow-up is required at this time.  Berle MullMiller, Archer Vise 02/25/2016, 10:33 AM

## 2016-02-29 ENCOUNTER — Other Ambulatory Visit: Payer: Self-pay

## 2016-02-29 ENCOUNTER — Encounter: Payer: Self-pay | Admitting: Obstetrics & Gynecology

## 2016-02-29 MED ORDER — FLUCONAZOLE 150 MG PO TABS: 150.0000 mg | ORAL_TABLET | Freq: Once | ORAL | 0 refills | Status: AC

## 2016-03-07 ENCOUNTER — Ambulatory Visit (INDEPENDENT_AMBULATORY_CARE_PROVIDER_SITE_OTHER): Payer: Medicaid Other | Admitting: Obstetrics & Gynecology

## 2016-03-07 ENCOUNTER — Encounter: Payer: Self-pay | Admitting: Obstetrics & Gynecology

## 2016-03-07 VITALS — BP 123/75 | HR 93 | Wt 171.0 lb

## 2016-03-07 DIAGNOSIS — O9989 Other specified diseases and conditions complicating pregnancy, childbirth and the puerperium: Secondary | ICD-10-CM

## 2016-03-07 DIAGNOSIS — O09899 Supervision of other high risk pregnancies, unspecified trimester: Secondary | ICD-10-CM

## 2016-03-07 DIAGNOSIS — Z23 Encounter for immunization: Secondary | ICD-10-CM

## 2016-03-07 DIAGNOSIS — Z283 Underimmunization status: Secondary | ICD-10-CM

## 2016-03-07 DIAGNOSIS — Z2839 Other underimmunization status: Secondary | ICD-10-CM

## 2016-03-07 DIAGNOSIS — Z3403 Encounter for supervision of normal first pregnancy, third trimester: Secondary | ICD-10-CM

## 2016-03-07 MED ORDER — TETANUS-DIPHTH-ACELL PERTUSSIS 5-2.5-18.5 LF-MCG/0.5 IM SUSP: 0.5000 mL | Freq: Once | INTRAMUSCULAR | Status: DC

## 2016-03-07 NOTE — Patient Instructions (Signed)
Third Trimester of Pregnancy The third trimester is from week 29 through week 40 (months 7 through 9). The third trimester is a time when the unborn baby (fetus) is growing rapidly. At the end of the ninth month, the fetus is about 20 inches in length and weighs 6-10 pounds. Body changes during your third trimester Your body goes through many changes during pregnancy. The changes vary from woman to woman. During the third trimester:  Your weight will continue to increase. You can expect to gain 25-35 pounds (11-16 kg) by the end of the pregnancy.  You may begin to get stretch marks on your hips, abdomen, and breasts.  You may urinate more often because the fetus is moving lower into your pelvis and pressing on your bladder.  You may develop or continue to have heartburn. This is caused by increased hormones that slow down muscles in the digestive tract.  You may develop or continue to have constipation because increased hormones slow digestion and cause the muscles that push waste through your intestines to relax.  You may develop hemorrhoids. These are swollen veins (varicose veins) in the rectum that can itch or be painful.  You may develop swollen, bulging veins (varicose veins) in your legs.  You may have increased body aches in the pelvis, back, or thighs. This is due to weight gain and increased hormones that are relaxing your joints.  You may have changes in your hair. These can include thickening of your hair, rapid growth, and changes in texture. Some women also have hair loss during or after pregnancy, or hair that feels dry or thin. Your hair will most likely return to normal after your baby is born.  Your breasts will continue to grow and they will continue to become tender. A yellow fluid (colostrum) may leak from your breasts. This is the first milk you are producing for your baby.  Your belly button may stick out.  You may notice more swelling in your hands, face, or  ankles.  You may have increased tingling or numbness in your hands, arms, and legs. The skin on your belly may also feel numb.  You may feel short of breath because of your expanding uterus.  You may have more problems sleeping. This can be caused by the size of your belly, increased need to urinate, and an increase in your body's metabolism.  You may notice the fetus "dropping," or moving lower in your abdomen.  You may have increased vaginal discharge.  Your cervix becomes thin and soft (effaced) near your due date. What to expect at prenatal visits You will have prenatal exams every 2 weeks until week 36. Then you will have weekly prenatal exams. During a routine prenatal visit:  You will be weighed to make sure you and the fetus are growing normally.  Your blood pressure will be taken.  Your abdomen will be measured to track your baby's growth.  The fetal heartbeat will be listened to.  Any test results from the previous visit will be discussed.  You may have a cervical check near your due date to see if you have effaced. At around 36 weeks, your health care provider will check your cervix. At the same time, your health care provider will also perform a test on the secretions of the vaginal tissue. This test is to determine if a type of bacteria, Group B streptococcus, is present. Your health care provider will explain this further. Your health care provider may ask you:    What your birth plan is.  How you are feeling.  If you are feeling the baby move.  If you have had any abnormal symptoms, such as leaking fluid, bleeding, severe headaches, or abdominal cramping.  If you are using any tobacco products, including cigarettes, chewing tobacco, and electronic cigarettes.  If you have any questions. Other tests or screenings that may be performed during your third trimester include:  Blood tests that check for low iron levels (anemia).  Fetal testing to check the health,  activity level, and growth of the fetus. Testing is done if you have certain medical conditions or if there are problems during the pregnancy.  Nonstress test (NST). This test checks the health of your baby to make sure there are no signs of problems, such as the baby not getting enough oxygen. During this test, a belt is placed around your belly. The baby is made to move, and its heart rate is monitored during movement. What is false labor? False labor is a condition in which you feel small, irregular tightenings of the muscles in the womb (contractions) that eventually go away. These are called Braxton Hicks contractions. Contractions may last for hours, days, or even weeks before true labor sets in. If contractions come at regular intervals, become more frequent, increase in intensity, or become painful, you should see your health care provider. What are the signs of labor?  Abdominal cramps.  Regular contractions that start at 10 minutes apart and become stronger and more frequent with time.  Contractions that start on the top of the uterus and spread down to the lower abdomen and back.  Increased pelvic pressure and dull back pain.  A watery or bloody mucus discharge that comes from the vagina.  Leaking of amniotic fluid. This is also known as your "water breaking." It could be a slow trickle or a gush. Let your doctor know if it has a color or strange odor. If you have any of these signs, call your health care provider right away, even if it is before your due date. Follow these instructions at home: Eating and drinking  Continue to eat regular, healthy meals.  Do not eat:  Raw meat or meat spreads.  Unpasteurized milk or cheese.  Unpasteurized juice.  Store-made salad.  Refrigerated smoked seafood.  Hot dogs or deli meat, unless they are piping hot.  More than 6 ounces of albacore tuna a week.  Shark, swordfish, king mackerel, or tile fish.  Store-made salads.  Raw  sprouts, such as mung bean or alfalfa sprouts.  Take prenatal vitamins as told by your health care provider.  Take 1000 mg of calcium daily as told by your health care provider.  If you develop constipation:  Take over-the-counter or prescription medicines.  Drink enough fluid to keep your urine clear or pale yellow.  Eat foods that are high in fiber, such as fresh fruits and vegetables, whole grains, and beans.  Limit foods that are high in fat and processed sugars, such as fried and sweet foods. Activity  Exercise only as directed by your health care provider. Healthy pregnant women should aim for 2 hours and 30 minutes of moderate exercise per week. If you experience any pain or discomfort while exercising, stop.  Avoid heavy lifting.  Do not exercise in extreme heat or humidity, or at high altitudes.  Wear low-heel, comfortable shoes.  Practice good posture.  Do not travel far distances unless it is absolutely necessary and only with the approval   of your health care provider.  Wear your seat belt at all times while in a car, on a bus, or on a plane.  Take frequent breaks and rest with your legs elevated if you have leg cramps or low back pain.  Do not use hot tubs, steam rooms, or saunas.  You may continue to have sex unless your health care provider tells you otherwise. Lifestyle  Do not use any products that contain nicotine or tobacco, such as cigarettes and e-cigarettes. If you need help quitting, ask your health care provider.  Do not drink alcohol.  Do not use any medicinal herbs or unprescribed drugs. These chemicals affect the formation and growth of the baby.  If you develop varicose veins:  Wear support pantyhose or compression stockings as told by your healthcare provider.  Elevate your feet for 15 minutes, 3-4 times a day.  Wear a supportive maternity bra to help with breast tenderness. General instructions  Take over-the-counter and prescription  medicines only as told by your health care provider. There are medicines that are either safe or unsafe to take during pregnancy.  Take warm sitz baths to soothe any pain or discomfort caused by hemorrhoids. Use hemorrhoid cream or witch hazel if your health care provider approves.  Avoid cat litter boxes and soil used by cats. These carry germs that can cause birth defects in the baby. If you have a cat, ask someone to clean the litter box for you.  To prepare for the arrival of your baby:  Take prenatal classes to understand, practice, and ask questions about the labor and delivery.  Make a trial run to the hospital.  Visit the hospital and tour the maternity area.  Arrange for maternity or paternity leave through employers.  Arrange for family and friends to take care of pets while you are in the hospital.  Purchase a rear-facing car seat and make sure you know how to install it in your car.  Pack your hospital bag.  Prepare the baby's nursery. Make sure to remove all pillows and stuffed animals from the baby's crib to prevent suffocation.  Visit your dentist if you have not gone during your pregnancy. Use a soft toothbrush to brush your teeth and be gentle when you floss.  Keep all prenatal follow-up visits as told by your health care provider. This is important. Contact a health care provider if:  You are unsure if you are in labor or if your water has broken.  You become dizzy.  You have mild pelvic cramps, pelvic pressure, or nagging pain in your abdominal area.  You have lower back pain.  You have persistent nausea, vomiting, or diarrhea.  You have an unusual or bad smelling vaginal discharge.  You have pain when you urinate. Get help right away if:  You have a fever.  You are leaking fluid from your vagina.  You have spotting or bleeding from your vagina.  You have severe abdominal pain or cramping.  You have rapid weight loss or weight gain.  You have  shortness of breath with chest pain.  You notice sudden or extreme swelling of your face, hands, ankles, feet, or legs.  Your baby makes fewer than 10 movements in 2 hours.  You have severe headaches that do not go away with medicine.  You have vision changes. Summary  The third trimester is from week 29 through week 40, months 7 through 9. The third trimester is a time when the unborn baby (fetus)   is growing rapidly.  During the third trimester, your discomfort may increase as you and your baby continue to gain weight. You may have abdominal, leg, and back pain, sleeping problems, and an increased need to urinate.  During the third trimester your breasts will keep growing and they will continue to become tender. A yellow fluid (colostrum) may leak from your breasts. This is the first milk you are producing for your baby.  False labor is a condition in which you feel small, irregular tightenings of the muscles in the womb (contractions) that eventually go away. These are called Braxton Hicks contractions. Contractions may last for hours, days, or even weeks before true labor sets in.  Signs of labor can include: abdominal cramps; regular contractions that start at 10 minutes apart and become stronger and more frequent with time; watery or bloody mucus discharge that comes from the vagina; increased pelvic pressure and dull back pain; and leaking of amniotic fluid. This information is not intended to replace advice given to you by your health care provider. Make sure you discuss any questions you have with your health care provider. Document Released: 01/07/2001 Document Revised: 06/21/2015 Document Reviewed: 03/16/2012 Elsevier Interactive Patient Education  2017 [employer]  

## 2016-03-07 NOTE — Progress Notes (Signed)
   PRENATAL VISIT NOTE  Subjective:  [name] is a 21 y.o. G1P0 at 6868w0d being seen today for ongoing prenatal care.  She is currently monitored for the following issues for this low-risk pregnancy and has Supervision of normal pregnancy and Rubella non-immune status, antepartum on her problem list.  Patient reports no complaints.  Contractions: Irritability. Vag. Bleeding: None.  Movement: Present. Denies leaking of fluid.   The following portions of the patient's history were reviewed and updated as appropriate: allergies, current medications, past family history, past medical history, past social history, past surgical history and problem list. Problem list updated.  Objective:   Vitals:   03/07/16 1017  BP: 123/75  Pulse: 93  Weight: 171 lb (77.6 kg)    Fetal Status: Fetal Heart Rate (bpm): 130   Movement: Present     General:  Alert, oriented and cooperative. Patient is in no acute distress.  Skin: Skin is warm and dry. No rash noted.   Cardiovascular: Normal heart rate noted  Respiratory: Normal respiratory effort, no problems with respiration noted  Abdomen: Soft, gravid, appropriate for gestational age. Pain/Pressure: Absent     Pelvic:  Cervical exam deferred        Extremities: Normal range of motion.  Edema: None  Mental Status: Normal mood and affect. Normal behavior. Normal judgment and thought content.   Assessment and Plan:  Pregnancy: G1P0 at 9468w0d  1. Encounter for supervision of normal first pregnancy in third trimester For Tdap today   2. Rubella non-immune status, antepartum Needs PP   Preterm labor symptoms and general obstetric precautions including but not limited to vaginal bleeding, contractions, leaking of fluid and fetal movement were reviewed in detail with the patient. Please refer to After Visit Summary for other counseling recommendations.  F/u in 2weeks or sooner prn  Willodean Rosenthalarolyn Harraway-Smith, MD

## 2016-03-09 NOTE — ED Notes (Signed)
Patient called on 03/08/16 and requested Diflucan for a yeast infection.  States she was seen her on 02/23/16 and spoke with Dr. Shyrl NumbersNanavanti about needing diflucan since she was given antibiotics.  Chart reviewed with Dr. Fayrene FearingJames, and he stated that the Diflucan is contraindicated in pregnancy and to have the patient take OTC Monostat.  Called patient on 03/08/16 but was unable to leave a message.  Patient called back today, 03/09/16 and was given the instructions to use monostat OTC as directed.

## 2016-03-10 ENCOUNTER — Encounter: Payer: Self-pay | Admitting: Obstetrics & Gynecology

## 2016-03-10 ENCOUNTER — Other Ambulatory Visit: Payer: Self-pay

## 2016-03-10 MED ORDER — FLUCONAZOLE 150 MG PO TABS: 150.0000 mg | ORAL_TABLET | Freq: Once | ORAL | 0 refills | Status: AC

## 2016-03-10 NOTE — Progress Notes (Signed)
Patient on antibiotics and has a yeast infection. Armandina StammerJennifer Howard RNBSN

## 2016-03-19 ENCOUNTER — Telehealth: Payer: Self-pay

## 2016-03-19 NOTE — Telephone Encounter (Signed)
Patient called stating she is 33.5 weeks. Patient complaining of cramping and some sharp pains radiating down her buttocks. Patient denies any bleeding or leaking of fluid. Patient states the baby has been moving well today. I asked patient how much water she has consumed in last 24 hours and she states "3 bottles". Patient instructed to try and rest and increased water intake to 8-10 glasses of water a day to stay hydrated. Patient also made aware that sometimes sharp pains in back that radiates to legs or buttock can be cause by sciatica nerve pain- and how the baby is laying.  Patient instructed to try the above measure but if her pain and cramping increases to [facility]. Patient states understanding.  Armandina StammerJennifer Giomar Gusler RNBSN

## 2016-03-20 ENCOUNTER — Encounter: Payer: Self-pay | Admitting: Obstetrics & Gynecology

## 2016-03-21 ENCOUNTER — Emergency Department (HOSPITAL_BASED_OUTPATIENT_CLINIC_OR_DEPARTMENT_OTHER)
Admission: EM | Admit: 2016-03-21 | Discharge: 2016-03-21 | Disposition: A | Discharge status: Home / Self Care | Payer: Medicaid Other | Attending: Emergency Medicine | Admitting: Emergency Medicine

## 2016-03-21 ENCOUNTER — Other Ambulatory Visit: Payer: Self-pay

## 2016-03-21 ENCOUNTER — Encounter (HOSPITAL_BASED_OUTPATIENT_CLINIC_OR_DEPARTMENT_OTHER): Payer: Self-pay | Admitting: *Deleted

## 2016-03-21 ENCOUNTER — Ambulatory Visit (INDEPENDENT_AMBULATORY_CARE_PROVIDER_SITE_OTHER): Payer: Medicaid Other | Admitting: Obstetrics & Gynecology

## 2016-03-21 VITALS — BP 113/70 | HR 10 | Wt 173.0 lb

## 2016-03-21 DIAGNOSIS — Z3A34 34 weeks gestation of pregnancy: Secondary | ICD-10-CM | POA: Diagnosis not present

## 2016-03-21 DIAGNOSIS — Z2839 Other underimmunization status: Secondary | ICD-10-CM

## 2016-03-21 DIAGNOSIS — R110 Nausea: Secondary | ICD-10-CM | POA: Diagnosis not present

## 2016-03-21 DIAGNOSIS — Z3403 Encounter for supervision of normal first pregnancy, third trimester: Secondary | ICD-10-CM

## 2016-03-21 DIAGNOSIS — O26893 Other specified pregnancy related conditions, third trimester: Secondary | ICD-10-CM | POA: Diagnosis present

## 2016-03-21 DIAGNOSIS — O09899 Supervision of other high risk pregnancies, unspecified trimester: Secondary | ICD-10-CM

## 2016-03-21 DIAGNOSIS — R1013 Epigastric pain: Secondary | ICD-10-CM

## 2016-03-21 DIAGNOSIS — Z283 Underimmunization status: Secondary | ICD-10-CM

## 2016-03-21 DIAGNOSIS — O9989 Other specified diseases and conditions complicating pregnancy, childbirth and the puerperium: Secondary | ICD-10-CM

## 2016-03-21 LAB — CBC WITH DIFFERENTIAL/PLATELET
BASOS PCT: 0 %
Basophils Absolute: 0 10*3/uL (ref 0.0–0.1)
EOS ABS: 0.1 10*3/uL (ref 0.0–0.7)
EOS PCT: 1 %
HCT: 33.4 % — ABNORMAL LOW (ref 36.0–46.0)
Hemoglobin: 10.6 g/dL — ABNORMAL LOW (ref 12.0–15.0)
Lymphocytes Relative: 18 %
Lymphs Abs: 1.7 10*3/uL (ref 0.7–4.0)
MCH: 25.7 pg — ABNORMAL LOW (ref 26.0–34.0)
MCHC: 31.7 g/dL (ref 30.0–36.0)
MCV: 81.1 fL (ref 78.0–100.0)
MONO ABS: 0.8 10*3/uL (ref 0.1–1.0)
Monocytes Relative: 8 %
Neutro Abs: 7.1 10*3/uL (ref 1.7–7.7)
Neutrophils Relative %: 73 %
PLATELETS: 187 10*3/uL (ref 150–400)
RBC: 4.12 MIL/uL (ref 3.87–5.11)
RDW: 15.6 % — AB (ref 11.5–15.5)
WBC: 9.7 10*3/uL (ref 4.0–10.5)

## 2016-03-21 LAB — URINALYSIS, ROUTINE W REFLEX MICROSCOPIC
BILIRUBIN URINE: NEGATIVE
GLUCOSE, UA: NEGATIVE mg/dL
HGB URINE DIPSTICK: NEGATIVE
KETONES UR: NEGATIVE mg/dL
Nitrite: NEGATIVE
PROTEIN: NEGATIVE mg/dL
Specific Gravity, Urine: 1.017 (ref 1.005–1.030)
pH: 7.5 (ref 5.0–8.0)

## 2016-03-21 LAB — COMPREHENSIVE METABOLIC PANEL
ALT: 12 U/L — ABNORMAL LOW (ref 14–54)
AST: 22 U/L (ref 15–41)
Albumin: 3.1 g/dL — ABNORMAL LOW (ref 3.5–5.0)
Alkaline Phosphatase: 95 U/L (ref 38–126)
Anion gap: 8 (ref 5–15)
BUN: 6 mg/dL (ref 6–20)
CHLORIDE: 110 mmol/L (ref 101–111)
CO2: 20 mmol/L — ABNORMAL LOW (ref 22–32)
Calcium: 8.5 mg/dL — ABNORMAL LOW (ref 8.9–10.3)
Creatinine, Ser: 0.38 mg/dL — ABNORMAL LOW (ref 0.44–1.00)
GFR calc Af Amer: >60 mL/min (ref >60)
Glucose, Bld: 81 mg/dL (ref 65–99)
POTASSIUM: 3.2 mmol/L — AB (ref 3.5–5.1)
Sodium: 138 mmol/L (ref 135–145)
Total Bilirubin: 0.4 mg/dL (ref 0.3–1.2)
Total Protein: 6.5 g/dL (ref 6.5–8.1)

## 2016-03-21 LAB — URINALYSIS, MICROSCOPIC (REFLEX): RBC / HPF: NONE SEEN RBC/hpf (ref 0–5)

## 2016-03-21 LAB — LIPASE, BLOOD: LIPASE: 27 U/L (ref 11–51)

## 2016-03-21 MED ORDER — ONDANSETRON 4 MG PO TBDP
4.0000 mg | ORAL_TABLET | Freq: Once | ORAL | Status: AC
  Administered 2016-03-21: 4 mg via ORAL

## 2016-03-21 MED ORDER — METOCLOPRAMIDE HCL 5 MG/ML IJ SOLN: 10.0000 mg | Freq: Once | INTRAMUSCULAR | Status: DC

## 2016-03-21 MED ORDER — ONDANSETRON 4 MG PO TBDP
ORAL_TABLET | ORAL | Status: AC
  Administered 2016-03-21: 4 mg via ORAL
  Filled 2016-03-21: qty 1

## 2016-03-21 NOTE — ED Triage Notes (Signed)
Chest tightness into her abdomen and back. She is [redacted] weeks pregnant. G1 P0. Pain comes and goes.

## 2016-03-21 NOTE — ED Notes (Signed)
[facility]oke with Heather at [facility] to notify pt on [facility]. She will notify rapid response RN and put pt in system

## 2016-03-21 NOTE — ED Notes (Signed)
Pt reports she saw her OBGYN today. Ate lunch (chickfila) and took a nap. When she woke up she was having midchest and epigastric pain. Also reports pain "all over" abdomen and in her back.

## 2016-03-21 NOTE — Progress Notes (Signed)
Received call from [facility] for a G1P0 34.[redacted] weeks pregnant, who came in complaining of epigastric pain, lower back pain. RN states vitals are stable and pt appears to be in no distress, and had her regular OB appointment this AM. Pt was placed on fetal monitor. Spoke with Dr Vergie LivingPickens, updated on pt status, reviewed FHR tracing. OB cleared and pt may come off monitor. ED RN made aware.

## 2016-03-21 NOTE — ED Provider Notes (Signed)
[facility]   CSN: [id number] Arrival date & time: 03/21/16  1356     History   Chief Complaint Chief Complaint  Patient presents with  . Chest Pain  . Abdominal Pain  . [redacted] Weeks Pregnant    HPI [name] is a 21 y.o. female.  HPI 21 year old female G1 P0 at 34st gestation awake who presents with intermittent abdominal pain. She has been in her usual state of health. An hour prior to arrival she woke up from a nap to have sharp squeezing intermittent abdominal pain. Pain generalized but worst in the epigastrium and radiated into the lower chest. Ate Chick fil'A prior to nap. Associated with nausea but no vomiting. No diarrhea, abnormal vaginal bleeding, abnormal vaginal discharge, dysuria, urinary frequency, fevers or chills. No cough, difficulty breathing, syncope or near syncope.  Past Medical History:  Diagnosis Date  . Eczema     Patient Active Problem List   Diagnosis Date Noted  . Rubella non-immune status, antepartum 01/24/2016  . Supervision of normal pregnancy 11/15/2015    Past Surgical History:  Procedure Laterality Date  . ovary removed      OB History    Gravida Para Term Preterm AB Living   1             SAB TAB Ectopic Multiple Live Births                   Home Medications    Prior to Admission medications   Medication Sig Start Date End Date Taking? Authorizing Provider  Prenatal Vit-Fe Fumarate-FA (PREPLUS) 27-1 MG TABS Take 1 tablet by mouth daily. 11/15/15  Yes Tereso NewcomerUgonna A Anyanwu, MD  amoxicillin-clavulanate (AUGMENTIN) 500-125 MG tablet Take 1 tablet (500 mg total) by mouth 2 (two) times daily. 02/23/16   Derwood KaplanAnkit Nanavati, MD  ondansetron (ZOFRAN-ODT) 4 MG disintegrating tablet Take 1 tablet (4 mg total) by mouth every 8 (eight) hours as needed for nausea. 02/23/16   Derwood KaplanAnkit Nanavati, MD    Family History Family History  Problem Relation Age of Onset  . Aneurysm Mother   . Fibroids Mother     Social  History Social History  Substance Use Topics  . Smoking status: Never Smoker  . Smokeless tobacco: Never Used  . Alcohol use No     Allergies   Patient has no active allergies.   Review of Systems Review of Systems 10/14 systems reviewed and are negative other than those stated in the HPI   Physical Exam Updated Vital Signs BP 124/87 (BP Location: Right Arm)   Pulse 98   Temp 98.3 F (36.8 C) (Oral)   Resp 18   Ht 5\' 4"  (1.626 m)   Wt 173 lb (78.5 kg)   LMP 07/27/2015   SpO2 100%   BMI 29.70 kg/m   Physical Exam Physical Exam  Nursing note and vitals reviewed. Constitutional: Well developed, well nourished, non-toxic, and in no acute distress Head: Normocephalic and atraumatic.  Mouth/Throat: Oropharynx is clear and moist.  Neck: Normal range of motion. Neck supple.  Cardiovascular: Normal rate and regular rhythm.   Pulmonary/Chest: Effort normal and breath sounds normal.  Abdominal: Soft. Nontender to palpation. Abdomen is gravid There is no rebound and no guarding.  Musculoskeletal: Normal range of motion.  Neurological: Alert, no facial droop, fluent speech, moves all extremities symmetrically Skin: Skin is warm and dry.  Psychiatric: Cooperative   ED Treatments / Results  Labs (all labs ordered are  listed, but only abnormal results are displayed) Labs Reviewed  URINALYSIS, ROUTINE W REFLEX MICROSCOPIC - Abnormal; Notable for the following:       Result Value   APPearance CLOUDY (*)    Leukocytes, UA TRACE (*)    All other components within normal limits  CBC WITH DIFFERENTIAL/PLATELET - Abnormal; Notable for the following:    Hemoglobin 10.6 (*)    HCT 33.4 (*)    MCH 25.7 (*)    RDW 15.6 (*)    All other components within normal limits  COMPREHENSIVE METABOLIC PANEL - Abnormal; Notable for the following:    Potassium 3.2 (*)    CO2 20 (*)    Creatinine, Ser 0.38 (*)    Calcium 8.5 (*)    Albumin 3.1 (*)    ALT 12 (*)    All other components  within normal limits  URINALYSIS, MICROSCOPIC (REFLEX) - Abnormal; Notable for the following:    Bacteria, UA FEW (*)    Squamous Epithelial / LPF 0-5 (*)    All other components within normal limits  URINE CULTURE  LIPASE, BLOOD    EKG  EKG Interpretation  Date/Time:  Friday March 21 2016 14:06:45 EST Ventricular Rate:  92 PR Interval:  140 QRS Duration: 88 QT Interval:  354 QTC Calculation: 437 R Axis:   46 Text Interpretation:  Normal sinus rhythm Normal ECG No prior EKG  Confirmed by Acheron Sugg MD, Mc Bloodworth ([id number]) on 03/21/2016 2:14:30 PM       Radiology No results found.  Procedures Procedures (including critical care time)  Medications Ordered in ED Medications  ondansetron (ZOFRAN-ODT) disintegrating tablet 4 mg (4 mg Oral Given 03/21/16 1519)     Initial Impression / Assessment and Plan / ED Course  I have reviewed the triage vital signs and the nursing notes.  Pertinent labs & imaging results that were available during my care of the patient were reviewed by me and considered in my medical decision making (see chart for details).     [redacted] weeks GA who presents with intermittent epigastric abdominal pain since one hour ago. Currently no abdominal pain. Well appearing. In no acute distress. Vital signs normal. Abdomen soft and benign. Placed on FHM, with appropriate FHR. [facility] felt fetus is well appearing and recommended discontinuation of fetal monitoring. Suspect potential GERD as cause of symptoms as just had eaten fried foods prior to taking nap. Will obtain CBC, CMP, Lipase and observe. Blood work unremarkable. Pain free in ED. Felt stable for discharge. Strict return and follow-up instructions reviewed. She expressed understanding of all discharge instructions and felt comfortable with the plan of care.   Final Clinical Impressions(s) / ED Diagnoses   Final diagnoses:  Epigastric abdominal pain    New Prescriptions New Prescriptions    No medications on file     Lavera Guise, MD 03/21/16 1556

## 2016-03-21 NOTE — Patient Instructions (Signed)
Third Trimester of Pregnancy The third trimester is from week 29 through week 40 (months 7 through 9). The third trimester is a time when the unborn baby (fetus) is growing rapidly. At the end of the ninth month, the fetus is about 20 inches in length and weighs 6-10 pounds. Body changes during your third trimester Your body goes through many changes during pregnancy. The changes vary from woman to woman. During the third trimester:  Your weight will continue to increase. You can expect to gain 25-35 pounds (11-16 kg) by the end of the pregnancy.  You may begin to get stretch marks on your hips, abdomen, and breasts.  You may urinate more often because the fetus is moving lower into your pelvis and pressing on your bladder.  You may develop or continue to have heartburn. This is caused by increased hormones that slow down muscles in the digestive tract.  You may develop or continue to have constipation because increased hormones slow digestion and cause the muscles that push waste through your intestines to relax.  You may develop hemorrhoids. These are swollen veins (varicose veins) in the rectum that can itch or be painful.  You may develop swollen, bulging veins (varicose veins) in your legs.  You may have increased body aches in the pelvis, back, or thighs. This is due to weight gain and increased hormones that are relaxing your joints.  You may have changes in your hair. These can include thickening of your hair, rapid growth, and changes in texture. Some women also have hair loss during or after pregnancy, or hair that feels dry or thin. Your hair will most likely return to normal after your baby is born.  Your breasts will continue to grow and they will continue to become tender. A yellow fluid (colostrum) may leak from your breasts. This is the first milk you are producing for your baby.  Your belly button may stick out.  You may notice more swelling in your hands, face, or  ankles.  You may have increased tingling or numbness in your hands, arms, and legs. The skin on your belly may also feel numb.  You may feel short of breath because of your expanding uterus.  You may have more problems sleeping. This can be caused by the size of your belly, increased need to urinate, and an increase in your body's metabolism.  You may notice the fetus "dropping," or moving lower in your abdomen.  You may have increased vaginal discharge.  Your cervix becomes thin and soft (effaced) near your due date. What to expect at prenatal visits You will have prenatal exams every 2 weeks until week 36. Then you will have weekly prenatal exams. During a routine prenatal visit:  You will be weighed to make sure you and the fetus are growing normally.  Your blood pressure will be taken.  Your abdomen will be measured to track your baby's growth.  The fetal heartbeat will be listened to.  Any test results from the previous visit will be discussed.  You may have a cervical check near your due date to see if you have effaced. At around 36 weeks, your health care provider will check your cervix. At the same time, your health care provider will also perform a test on the secretions of the vaginal tissue. This test is to determine if a type of bacteria, Group B streptococcus, is present. Your health care provider will explain this further. Your health care provider may ask you:    What your birth plan is.  How you are feeling.  If you are feeling the baby move.  If you have had any abnormal symptoms, such as leaking fluid, bleeding, severe headaches, or abdominal cramping.  If you are using any tobacco products, including cigarettes, chewing tobacco, and electronic cigarettes.  If you have any questions. Other tests or screenings that may be performed during your third trimester include:  Blood tests that check for low iron levels (anemia).  Fetal testing to check the health,  activity level, and growth of the fetus. Testing is done if you have certain medical conditions or if there are problems during the pregnancy.  Nonstress test (NST). This test checks the health of your baby to make sure there are no signs of problems, such as the baby not getting enough oxygen. During this test, a belt is placed around your belly. The baby is made to move, and its heart rate is monitored during movement. What is false labor? False labor is a condition in which you feel small, irregular tightenings of the muscles in the womb (contractions) that eventually go away. These are called Braxton Hicks contractions. Contractions may last for hours, days, or even weeks before true labor sets in. If contractions come at regular intervals, become more frequent, increase in intensity, or become painful, you should see your health care provider. What are the signs of labor?  Abdominal cramps.  Regular contractions that start at 10 minutes apart and become stronger and more frequent with time.  Contractions that start on the top of the uterus and spread down to the lower abdomen and back.  Increased pelvic pressure and dull back pain.  A watery or bloody mucus discharge that comes from the vagina.  Leaking of amniotic fluid. This is also known as your "water breaking." It could be a slow trickle or a gush. Let your doctor know if it has a color or strange odor. If you have any of these signs, call your health care provider right away, even if it is before your due date. Follow these instructions at home: Eating and drinking  Continue to eat regular, healthy meals.  Do not eat:  Raw meat or meat spreads.  Unpasteurized milk or cheese.  Unpasteurized juice.  Store-made salad.  Refrigerated smoked seafood.  Hot dogs or deli meat, unless they are piping hot.  More than 6 ounces of albacore tuna a week.  Shark, swordfish, king mackerel, or tile fish.  Store-made salads.  Raw  sprouts, such as mung bean or alfalfa sprouts.  Take prenatal vitamins as told by your health care provider.  Take 1000 mg of calcium daily as told by your health care provider.  If you develop constipation:  Take over-the-counter or prescription medicines.  Drink enough fluid to keep your urine clear or pale yellow.  Eat foods that are high in fiber, such as fresh fruits and vegetables, whole grains, and beans.  Limit foods that are high in fat and processed sugars, such as fried and sweet foods. Activity  Exercise only as directed by your health care provider. Healthy pregnant women should aim for 2 hours and 30 minutes of moderate exercise per week. If you experience any pain or discomfort while exercising, stop.  Avoid heavy lifting.  Do not exercise in extreme heat or humidity, or at high altitudes.  Wear low-heel, comfortable shoes.  Practice good posture.  Do not travel far distances unless it is absolutely necessary and only with the approval   of your health care provider.  Wear your seat belt at all times while in a car, on a bus, or on a plane.  Take frequent breaks and rest with your legs elevated if you have leg cramps or low back pain.  Do not use hot tubs, steam rooms, or saunas.  You may continue to have sex unless your health care provider tells you otherwise. Lifestyle  Do not use any products that contain nicotine or tobacco, such as cigarettes and e-cigarettes. If you need help quitting, ask your health care provider.  Do not drink alcohol.  Do not use any medicinal herbs or unprescribed drugs. These chemicals affect the formation and growth of the baby.  If you develop varicose veins:  Wear support pantyhose or compression stockings as told by your healthcare provider.  Elevate your feet for 15 minutes, 3-4 times a day.  Wear a supportive maternity bra to help with breast tenderness. General instructions  Take over-the-counter and prescription  medicines only as told by your health care provider. There are medicines that are either safe or unsafe to take during pregnancy.  Take warm sitz baths to soothe any pain or discomfort caused by hemorrhoids. Use hemorrhoid cream or witch hazel if your health care provider approves.  Avoid cat litter boxes and soil used by cats. These carry germs that can cause birth defects in the baby. If you have a cat, ask someone to clean the litter box for you.  To prepare for the arrival of your baby:  Take prenatal classes to understand, practice, and ask questions about the labor and delivery.  Make a trial run to the hospital.  Visit the hospital and tour the maternity area.  Arrange for maternity or paternity leave through employers.  Arrange for family and friends to take care of pets while you are in the hospital.  Purchase a rear-facing car seat and make sure you know how to install it in your car.  Pack your hospital bag.  Prepare the baby's nursery. Make sure to remove all pillows and stuffed animals from the baby's crib to prevent suffocation.  Visit your dentist if you have not gone during your pregnancy. Use a soft toothbrush to brush your teeth and be gentle when you floss.  Keep all prenatal follow-up visits as told by your health care provider. This is important. Contact a health care provider if:  You are unsure if you are in labor or if your water has broken.  You become dizzy.  You have mild pelvic cramps, pelvic pressure, or nagging pain in your abdominal area.  You have lower back pain.  You have persistent nausea, vomiting, or diarrhea.  You have an unusual or bad smelling vaginal discharge.  You have pain when you urinate. Get help right away if:  You have a fever.  You are leaking fluid from your vagina.  You have spotting or bleeding from your vagina.  You have severe abdominal pain or cramping.  You have rapid weight loss or weight gain.  You have  shortness of breath with chest pain.  You notice sudden or extreme swelling of your face, hands, ankles, feet, or legs.  Your baby makes fewer than 10 movements in 2 hours.  You have severe headaches that do not go away with medicine.  You have vision changes. Summary  The third trimester is from week 29 through week 40, months 7 through 9. The third trimester is a time when the unborn baby (fetus)   is growing rapidly.  During the third trimester, your discomfort may increase as you and your baby continue to gain weight. You may have abdominal, leg, and back pain, sleeping problems, and an increased need to urinate.  During the third trimester your breasts will keep growing and they will continue to become tender. A yellow fluid (colostrum) may leak from your breasts. This is the first milk you are producing for your baby.  False labor is a condition in which you feel small, irregular tightenings of the muscles in the womb (contractions) that eventually go away. These are called Braxton Hicks contractions. Contractions may last for hours, days, or even weeks before true labor sets in.  Signs of labor can include: abdominal cramps; regular contractions that start at 10 minutes apart and become stronger and more frequent with time; watery or bloody mucus discharge that comes from the vagina; increased pelvic pressure and dull back pain; and leaking of amniotic fluid. This information is not intended to replace advice given to you by your health care provider. Make sure you discuss any questions you have with your health care provider. Document Released: 01/07/2001 Document Revised: 06/21/2015 Document Reviewed: 03/16/2012 Elsevier Interactive Patient Education  2017 [employer]  

## 2016-03-21 NOTE — Progress Notes (Signed)
   PRENATAL VISIT NOTE  Subjective:  [name] is a 21 y.o. G1P0 at 2645w0d being seen today for ongoing prenatal care.  She is currently monitored for the following issues for this low-risk pregnancy and has Supervision of normal pregnancy and Rubella non-immune status, antepartum on her problem list.  Patient reports no complaints.  Contractions: Irritability. Vag. Bleeding: None.  Movement: Present. Denies leaking of fluid.   The following portions of the patient's history were reviewed and updated as appropriate: allergies, current medications, past family history, past medical history, past social history, past surgical history and problem list. Problem list updated.  Objective:   Vitals:   03/21/16 0918  BP: 113/70  Pulse: (!) 10  Weight: 173 lb (78.5 kg)    Fetal Status:     Movement: Present     General:  Alert, oriented and cooperative. Patient is in no acute distress.  Skin: Skin is warm and dry. No rash noted.   Cardiovascular: Normal heart rate noted  Respiratory: Normal respiratory effort, no problems with respiration noted  Abdomen: Soft, gravid, appropriate for gestational age. Pain/Pressure: Absent     Pelvic:  Cervical exam deferred        Extremities: Normal range of motion.  Edema: None  Mental Status: Normal mood and affect. Normal behavior. Normal judgment and thought content.   Assessment and Plan:  Pregnancy: G1P0 at 5945w0d  1. Encounter for supervision of normal first pregnancy in third trimester Reviewed pain management in labor 36 week labs next visit  2. Rubella non-immune status, antepartum Needs vacc PP  Preterm labor symptoms and general obstetric precautions including but not limited to vaginal bleeding, contractions, leaking of fluid and fetal movement were reviewed in detail with the patient. Please refer to After Visit Summary for other counseling recommendations.  Return in about 2 weeks (around 04/04/2016).   Willodean Rosenthalarolyn Harraway-Smith,  MD

## 2016-03-21 NOTE — ED Notes (Signed)
ED Provider at bedside. 

## 2016-03-21 NOTE — Discharge Instructions (Addendum)
Your fetal monitoring was reassuring. Everything looks good with your baby.  Your blood work is reassuring. Your pain may be related to reflux from eating fried foods earlier  Please follow-up with your OB as needed  Return for worsening symptoms, including fever, worsening pain, intractable vomiting or any other symptoms concerning to you.

## 2016-03-21 NOTE — ED Notes (Signed)
Spoke with [name] from [facility] and states baby looks god and she can off FHM

## 2016-03-21 NOTE — ED Notes (Signed)
Urine sample taken to lab. 

## 2016-03-23 LAB — URINE CULTURE

## 2016-04-04 ENCOUNTER — Ambulatory Visit (INDEPENDENT_AMBULATORY_CARE_PROVIDER_SITE_OTHER): Payer: Medicaid Other | Admitting: Family Medicine

## 2016-04-04 VITALS — BP 130/82 | HR 98 | Wt 172.0 lb

## 2016-04-04 DIAGNOSIS — Z3403 Encounter for supervision of normal first pregnancy, third trimester: Secondary | ICD-10-CM

## 2016-04-04 DIAGNOSIS — Z3A36 36 weeks gestation of pregnancy: Secondary | ICD-10-CM

## 2016-04-04 NOTE — Patient Instructions (Signed)
Third Trimester of Pregnancy The third trimester is from week 28 through week 40 (months 7 through 9). The third trimester is a time when the unborn baby (fetus) is growing rapidly. At the end of the ninth month, the fetus is about 20 inches in length and weighs 6-10 pounds. Body changes during your third trimester Your body will continue to go through many changes during pregnancy. The changes vary from woman to woman. During the third trimester:  Your weight will continue to increase. You can expect to gain 25-35 pounds (11-16 kg) by the end of the pregnancy.  You may begin to get stretch marks on your hips, abdomen, and breasts.  You may urinate more often because the fetus is moving lower into your pelvis and pressing on your bladder.  You may develop or continue to have heartburn. This is caused by increased hormones that slow down muscles in the digestive tract.  You may develop or continue to have constipation because increased hormones slow digestion and cause the muscles that push waste through your intestines to relax.  You may develop hemorrhoids. These are swollen veins (varicose veins) in the rectum that can itch or be painful.  You may develop swollen, bulging veins (varicose veins) in your legs.  You may have increased body aches in the pelvis, back, or thighs. This is due to weight gain and increased hormones that are relaxing your joints.  You may have changes in your hair. These can include thickening of your hair, rapid growth, and changes in texture. Some women also have hair loss during or after pregnancy, or hair that feels dry or thin. Your hair will most likely return to normal after your baby is born.  Your breasts will continue to grow and they will continue to become tender. A yellow fluid (colostrum) may leak from your breasts. This is the first milk you are producing for your baby.  Your belly button may stick out.  You may notice more swelling in your hands,  face, or ankles.  You may have increased tingling or numbness in your hands, arms, and legs. The skin on your belly may also feel numb.  You may feel short of breath because of your expanding uterus.  You may have more problems sleeping. This can be caused by the size of your belly, increased need to urinate, and an increase in your body's metabolism.  You may notice the fetus "dropping," or moving lower in your abdomen (lightening).  You may have increased vaginal discharge.  You may notice your joints feel loose and you may have pain around your pelvic bone.  What to expect at prenatal visits You will have prenatal exams every 2 weeks until week 36. Then you will have weekly prenatal exams. During a routine prenatal visit:  You will be weighed to make sure you and the baby are growing normally.  Your blood pressure will be taken.  Your abdomen will be measured to track your baby's growth.  The fetal heartbeat will be listened to.  Any test results from the previous visit will be discussed.  You may have a cervical check near your due date to see if your cervix has softened or thinned (effaced).  You will be tested for Group B streptococcus. This happens between 35 and 37 weeks.  Your health care provider may ask you:  What your birth plan is.  How you are feeling.  If you are feeling the baby move.  If you have had   any abnormal symptoms, such as leaking fluid, bleeding, severe headaches, or abdominal cramping.  If you are using any tobacco products, including cigarettes, chewing tobacco, and electronic cigarettes.  If you have any questions.  Other tests or screenings that may be performed during your third trimester include:  Blood tests that check for low iron levels (anemia).  Fetal testing to check the health, activity level, and growth of the fetus. Testing is done if you have certain medical conditions or if there are problems during the  pregnancy.  Nonstress test (NST). This test checks the health of your baby to make sure there are no signs of problems, such as the baby not getting enough oxygen. During this test, a belt is placed around your belly. The baby is made to move, and its heart rate is monitored during movement.  What is false labor? False labor is a condition in which you feel small, irregular tightenings of the muscles in the womb (contractions) that usually go away with rest, changing position, or drinking water. These are called Braxton Hicks contractions. Contractions may last for hours, days, or even weeks before true labor sets in. If contractions come at regular intervals, become more frequent, increase in intensity, or become painful, you should see your health care provider. What are the signs of labor?  Abdominal cramps.  Regular contractions that start at 10 minutes apart and become stronger and more frequent with time.  Contractions that start on the top of the uterus and spread down to the lower abdomen and back.  Increased pelvic pressure and dull back pain.  A watery or bloody mucus discharge that comes from the vagina.  Leaking of amniotic fluid. This is also known as your "water breaking." It could be a slow trickle or a gush. Let your health care provider know if it has a color or strange odor. If you have any of these signs, call your health care provider right away, even if it is before your due date. Follow these instructions at home: Medicines  Follow your health care provider's instructions regarding medicine use. Specific medicines may be either safe or unsafe to take during pregnancy.  Take a prenatal vitamin that contains at least 600 micrograms (mcg) of folic acid.  If you develop constipation, try taking a stool softener if your health care provider approves. Eating and drinking  Eat a balanced diet that includes fresh fruits and vegetables, whole grains, good sources of protein  such as meat, eggs, or tofu, and low-fat dairy. Your health care provider will help you determine the amount of weight gain that is right for you.  Avoid raw meat and uncooked cheese. These carry germs that can cause birth defects in the baby.  If you have low calcium intake from food, talk to your health care provider about whether you should take a daily calcium supplement.  Eat four or five small meals rather than three large meals a day.  Limit foods that are high in fat and processed sugars, such as fried and sweet foods.  To prevent constipation: ? Drink enough fluid to keep your urine clear or pale yellow. ? Eat foods that are high in fiber, such as fresh fruits and vegetables, whole grains, and beans. Activity  Exercise only as directed by your health care provider. Most women can continue their usual exercise routine during pregnancy. Try to exercise for 30 minutes at least 5 days a week. Stop exercising if you experience uterine contractions.  Avoid heavy   lifting.  Do not exercise in extreme heat or humidity, or at high altitudes.  Wear low-heel, comfortable shoes.  Practice good posture.  You may continue to have sex unless your health care provider tells you otherwise. Relieving pain and discomfort  Take frequent breaks and rest with your legs elevated if you have leg cramps or low back pain.  Take warm sitz baths to soothe any pain or discomfort caused by hemorrhoids. Use hemorrhoid cream if your health care provider approves.  Wear a good support bra to prevent discomfort from breast tenderness.  If you develop varicose veins: ? Wear support pantyhose or compression stockings as told by your healthcare provider. ? Elevate your feet for 15 minutes, 3-4 times a day. Prenatal care  Write down your questions. Take them to your prenatal visits.  Keep all your prenatal visits as told by your health care provider. This is important. Safety  Wear your seat belt at  all times when driving.  Make a list of emergency phone numbers, including numbers for family, friends, the hospital, and police and fire departments. General instructions  Avoid cat litter boxes and soil used by cats. These carry germs that can cause birth defects in the baby. If you have a cat, ask someone to clean the litter box for you.  Do not travel far distances unless it is absolutely necessary and only with the approval of your health care provider.  Do not use hot tubs, steam rooms, or saunas.  Do not drink alcohol.  Do not use any products that contain nicotine or tobacco, such as cigarettes and e-cigarettes. If you need help quitting, ask your health care provider.  Do not use any medicinal herbs or unprescribed drugs. These chemicals affect the formation and growth of the baby.  Do not douche or use tampons or scented sanitary pads.  Do not cross your legs for long periods of time.  To prepare for the arrival of your baby: ? Take prenatal classes to understand, practice, and ask questions about labor and delivery. ? Make a trial run to the hospital. ? Visit the hospital and tour the maternity area. ? Arrange for maternity or paternity leave through employers. ? Arrange for family and friends to take care of pets while you are in the hospital. ? Purchase a rear-facing car seat and make sure you know how to install it in your car. ? Pack your hospital bag. ? Prepare the baby's nursery. Make sure to remove all pillows and stuffed animals from the baby's crib to prevent suffocation.  Visit your dentist if you have not gone during your pregnancy. Use a soft toothbrush to brush your teeth and be gentle when you floss. Contact a health care provider if:  You are unsure if you are in labor or if your water has broken.  You become dizzy.  You have mild pelvic cramps, pelvic pressure, or nagging pain in your abdominal area.  You have lower back pain.  You have persistent  nausea, vomiting, or diarrhea.  You have an unusual or bad smelling vaginal discharge.  You have pain when you urinate. Get help right away if:  Your water breaks before 37 weeks.  You have regular contractions less than 5 minutes apart before 37 weeks.  You have a fever.  You are leaking fluid from your vagina.  You have spotting or bleeding from your vagina.  You have severe abdominal pain or cramping.  You have rapid weight loss or weight gain.    You have shortness of breath with chest pain.  You notice sudden or extreme swelling of your face, hands, ankles, feet, or legs.  Your baby makes fewer than 10 movements in 2 hours.  You have severe headaches that do not go away when you take medicine.  You have vision changes. Summary  The third trimester is from week 28 through week 40, months 7 through 9. The third trimester is a time when the unborn baby (fetus) is growing rapidly.  During the third trimester, your discomfort may increase as you and your baby continue to gain weight. You may have abdominal, leg, and back pain, sleeping problems, and an increased need to urinate.  During the third trimester your breasts will keep growing and they will continue to become tender. A yellow fluid (colostrum) may leak from your breasts. This is the first milk you are producing for your baby.  False labor is a condition in which you feel small, irregular tightenings of the muscles in the womb (contractions) that eventually go away. These are called Braxton Hicks contractions. Contractions may last for hours, days, or even weeks before true labor sets in.  Signs of labor can include: abdominal cramps; regular contractions that start at 10 minutes apart and become stronger and more frequent with time; watery or bloody mucus discharge that comes from the vagina; increased pelvic pressure and dull back pain; and leaking of amniotic fluid. This information is not intended to replace advice  given to you by your health care provider. Make sure you discuss any questions you have with your health care provider. Document Released: 01/07/2001 Document Revised: 06/21/2015 Document Reviewed: 03/16/2012 Elsevier Interactive Patient Education  2017 [employer]  

## 2016-04-04 NOTE — Progress Notes (Signed)
GBS Swab done.

## 2016-04-04 NOTE — Progress Notes (Signed)
   PRENATAL VISIT NOTE  Subjective:  [name] is a 21 y.o. G1P0 at 5722w0d being seen today for ongoing prenatal care.  She is currently monitored for the following issues for this low-risk pregnancy and has Supervision of normal pregnancy and Rubella non-immune status, antepartum on her problem list.  Patient reports no complaints.  Contractions: Irritability. Vag. Bleeding: None.  Movement: Present. Denies leaking of fluid.   The following portions of the patient's history were reviewed and updated as appropriate: allergies, current medications, past family history, past medical history, past social history, past surgical history and problem list. Problem list updated.  Objective:   Vitals:   04/04/16 1019  BP: 130/82  Pulse: 98  Weight: 172 lb (78 kg)    Fetal Status: Fetal Heart Rate (bpm): 132 Fundal Height: 34 cm Movement: Present  Presentation: Vertex  General:  Alert, oriented and cooperative. Patient is in no acute distress.  Skin: Skin is warm and dry. No rash noted.   Cardiovascular: Normal heart rate noted  Respiratory: Normal respiratory effort, no problems with respiration noted  Abdomen: Soft, gravid, appropriate for gestational age. Pain/Pressure: Present     Pelvic:  Cervical exam deferred        Extremities: Normal range of motion.  Edema: None  Mental Status: Normal mood and affect. Normal behavior. Normal judgment and thought content.   Assessment and Plan:  Pregnancy: G1P0 at 722w0d  1. [redacted] weeks gestation of pregnancy Cultures today Continue routine prenatal care.  - Strep Gp B NAA - GC/Chlamydia Probe Amp  Term labor symptoms and general obstetric precautions including but not limited to vaginal bleeding, contractions, leaking of fluid and fetal movement were reviewed in detail with the patient. Please refer to After Visit Summary for other counseling recommendations.  Return in 1 week (on 04/11/2016).   Reva Boresanya S Mansa Willers, MD

## 2016-04-06 LAB — GC/CHLAMYDIA PROBE AMP
CHLAMYDIA, DNA PROBE: NEGATIVE
Neisseria gonorrhoeae by PCR: NEGATIVE

## 2016-04-06 LAB — STREP GP B NAA: STREP GROUP B AG: NEGATIVE

## 2016-04-07 ENCOUNTER — Encounter: Payer: Self-pay | Admitting: Obstetrics & Gynecology

## 2016-04-10 ENCOUNTER — Ambulatory Visit (INDEPENDENT_AMBULATORY_CARE_PROVIDER_SITE_OTHER): Payer: Medicaid Other | Admitting: Obstetrics & Gynecology

## 2016-04-10 VITALS — BP 119/73 | HR 86 | Wt 173.0 lb

## 2016-04-10 DIAGNOSIS — O09899 Supervision of other high risk pregnancies, unspecified trimester: Secondary | ICD-10-CM

## 2016-04-10 DIAGNOSIS — Z283 Underimmunization status: Secondary | ICD-10-CM

## 2016-04-10 DIAGNOSIS — O9989 Other specified diseases and conditions complicating pregnancy, childbirth and the puerperium: Secondary | ICD-10-CM

## 2016-04-10 DIAGNOSIS — Z3403 Encounter for supervision of normal first pregnancy, third trimester: Secondary | ICD-10-CM

## 2016-04-10 DIAGNOSIS — Z2839 Other underimmunization status: Secondary | ICD-10-CM

## 2016-04-10 NOTE — Progress Notes (Signed)
   PRENATAL VISIT NOTE  Subjective:  [name] is a 21 y.o. G1P0 at 37w6dbeing seen today for ongoing prenatal care.  She is currently monitored for the following issues for this low-risk pregnancy and has Supervision of normal pregnancy and Rubella non-immune status, antepartum on her problem list.  Patient reports mucous discharge; resolved abscess in left groin. .  Contractions: Irritability. Vag. Bleeding: None.  Movement: Present. Denies leaking of fluid.   The following portions of the patient's history were reviewed and updated as appropriate: allergies, current medications, past family history, past medical history, past social history, past surgical history and problem list. Problem list updated.  Objective:   Vitals:   04/10/16 0911  BP: 119/73  Pulse: 86  Weight: 173 lb (78.5 kg)    Fetal Status: Fetal Heart Rate (bpm): 128   Movement: Present     General:  Alert, oriented and cooperative. Patient is in no acute distress.  Skin: Skin is warm and dry. No rash noted.   Cardiovascular: Normal heart rate noted  Respiratory: Normal respiratory effort, no problems with respiration noted  Abdomen: Soft, gravid, appropriate for gestational age. Pain/Pressure: Absent     Pelvic:  Cervical exam deferred        Extremities: Normal range of motion.  Edema: None  Mental Status: Normal mood and affect. Normal behavior. Normal judgment and thought content.  Left groin eval- small indurated area.  No fluctuance.  Not tender at present  Assessment and Plan:  Pregnancy: G1P0 at 391w6d1. Encounter for supervision of normal first pregnancy in third trimester Pts mother accompanied her this visit  2. Rubella non-immune status, antepartum Needs MMR PP  Preterm labor symptoms and general obstetric precautions including but not limited to vaginal bleeding, contractions, leaking of fluid and fetal movement were reviewed in detail with the patient. Please refer to After Visit Summary  for other counseling recommendations.  Return in about 1 week (around 04/17/2016).   CaLavonia DraftsMD

## 2016-04-10 NOTE — Patient Instructions (Signed)
Third Trimester of Pregnancy The third trimester is from week 28 through week 40 (months 7 through 9). The third trimester is a time when the unborn baby (fetus) is growing rapidly. At the end of the ninth month, the fetus is about 20 inches in length and weighs 6-10 pounds. Body changes during your third trimester Your body will continue to go through many changes during pregnancy. The changes vary from woman to woman. During the third trimester:  Your weight will continue to increase. You can expect to gain 25-35 pounds (11-16 kg) by the end of the pregnancy.  You may begin to get stretch marks on your hips, abdomen, and breasts.  You may urinate more often because the fetus is moving lower into your pelvis and pressing on your bladder.  You may develop or continue to have heartburn. This is caused by increased hormones that slow down muscles in the digestive tract.  You may develop or continue to have constipation because increased hormones slow digestion and cause the muscles that push waste through your intestines to relax.  You may develop hemorrhoids. These are swollen veins (varicose veins) in the rectum that can itch or be painful.  You may develop swollen, bulging veins (varicose veins) in your legs.  You may have increased body aches in the pelvis, back, or thighs. This is due to weight gain and increased hormones that are relaxing your joints.  You may have changes in your hair. These can include thickening of your hair, rapid growth, and changes in texture. Some women also have hair loss during or after pregnancy, or hair that feels dry or thin. Your hair will most likely return to normal after your baby is born.  Your breasts will continue to grow and they will continue to become tender. A yellow fluid (colostrum) may leak from your breasts. This is the first milk you are producing for your baby.  Your belly button may stick out.  You may notice more swelling in your hands,  face, or ankles.  You may have increased tingling or numbness in your hands, arms, and legs. The skin on your belly may also feel numb.  You may feel short of breath because of your expanding uterus.  You may have more problems sleeping. This can be caused by the size of your belly, increased need to urinate, and an increase in your body's metabolism.  You may notice the fetus "dropping," or moving lower in your abdomen (lightening).  You may have increased vaginal discharge.  You may notice your joints feel loose and you may have pain around your pelvic bone.  What to expect at prenatal visits You will have prenatal exams every 2 weeks until week 36. Then you will have weekly prenatal exams. During a routine prenatal visit:  You will be weighed to make sure you and the baby are growing normally.  Your blood pressure will be taken.  Your abdomen will be measured to track your baby's growth.  The fetal heartbeat will be listened to.  Any test results from the previous visit will be discussed.  You may have a cervical check near your due date to see if your cervix has softened or thinned (effaced).  You will be tested for Group B streptococcus. This happens between 35 and 37 weeks.  Your health care provider may ask you:  What your birth plan is.  How you are feeling.  If you are feeling the baby move.  If you have had   any abnormal symptoms, such as leaking fluid, bleeding, severe headaches, or abdominal cramping.  If you are using any tobacco products, including cigarettes, chewing tobacco, and electronic cigarettes.  If you have any questions.  Other tests or screenings that may be performed during your third trimester include:  Blood tests that check for low iron levels (anemia).  Fetal testing to check the health, activity level, and growth of the fetus. Testing is done if you have certain medical conditions or if there are problems during the  pregnancy.  Nonstress test (NST). This test checks the health of your baby to make sure there are no signs of problems, such as the baby not getting enough oxygen. During this test, a belt is placed around your belly. The baby is made to move, and its heart rate is monitored during movement.  What is false labor? False labor is a condition in which you feel small, irregular tightenings of the muscles in the womb (contractions) that usually go away with rest, changing position, or drinking water. These are called Braxton Hicks contractions. Contractions may last for hours, days, or even weeks before true labor sets in. If contractions come at regular intervals, become more frequent, increase in intensity, or become painful, you should see your health care provider. What are the signs of labor?  Abdominal cramps.  Regular contractions that start at 10 minutes apart and become stronger and more frequent with time.  Contractions that start on the top of the uterus and spread down to the lower abdomen and back.  Increased pelvic pressure and dull back pain.  A watery or bloody mucus discharge that comes from the vagina.  Leaking of amniotic fluid. This is also known as your "water breaking." It could be a slow trickle or a gush. Let your health care provider know if it has a color or strange odor. If you have any of these signs, call your health care provider right away, even if it is before your due date. Follow these instructions at home: Medicines  Follow your health care provider's instructions regarding medicine use. Specific medicines may be either safe or unsafe to take during pregnancy.  Take a prenatal vitamin that contains at least 600 micrograms (mcg) of folic acid.  If you develop constipation, try taking a stool softener if your health care provider approves. Eating and drinking  Eat a balanced diet that includes fresh fruits and vegetables, whole grains, good sources of protein  such as meat, eggs, or tofu, and low-fat dairy. Your health care provider will help you determine the amount of weight gain that is right for you.  Avoid raw meat and uncooked cheese. These carry germs that can cause birth defects in the baby.  If you have low calcium intake from food, talk to your health care provider about whether you should take a daily calcium supplement.  Eat four or five small meals rather than three large meals a day.  Limit foods that are high in fat and processed sugars, such as fried and sweet foods.  To prevent constipation: ? Drink enough fluid to keep your urine clear or pale yellow. ? Eat foods that are high in fiber, such as fresh fruits and vegetables, whole grains, and beans. Activity  Exercise only as directed by your health care provider. Most women can continue their usual exercise routine during pregnancy. Try to exercise for 30 minutes at least 5 days a week. Stop exercising if you experience uterine contractions.  Avoid heavy   lifting.  Do not exercise in extreme heat or humidity, or at high altitudes.  Wear low-heel, comfortable shoes.  Practice good posture.  You may continue to have sex unless your health care provider tells you otherwise. Relieving pain and discomfort  Take frequent breaks and rest with your legs elevated if you have leg cramps or low back pain.  Take warm sitz baths to soothe any pain or discomfort caused by hemorrhoids. Use hemorrhoid cream if your health care provider approves.  Wear a good support bra to prevent discomfort from breast tenderness.  If you develop varicose veins: ? Wear support pantyhose or compression stockings as told by your healthcare provider. ? Elevate your feet for 15 minutes, 3-4 times a day. Prenatal care  Write down your questions. Take them to your prenatal visits.  Keep all your prenatal visits as told by your health care provider. This is important. Safety  Wear your seat belt at  all times when driving.  Make a list of emergency phone numbers, including numbers for family, friends, the hospital, and police and fire departments. General instructions  Avoid cat litter boxes and soil used by cats. These carry germs that can cause birth defects in the baby. If you have a cat, ask someone to clean the litter box for you.  Do not travel far distances unless it is absolutely necessary and only with the approval of your health care provider.  Do not use hot tubs, steam rooms, or saunas.  Do not drink alcohol.  Do not use any products that contain nicotine or tobacco, such as cigarettes and e-cigarettes. If you need help quitting, ask your health care provider.  Do not use any medicinal herbs or unprescribed drugs. These chemicals affect the formation and growth of the baby.  Do not douche or use tampons or scented sanitary pads.  Do not cross your legs for long periods of time.  To prepare for the arrival of your baby: ? Take prenatal classes to understand, practice, and ask questions about labor and delivery. ? Make a trial run to the hospital. ? Visit the hospital and tour the maternity area. ? Arrange for maternity or paternity leave through employers. ? Arrange for family and friends to take care of pets while you are in the hospital. ? Purchase a rear-facing car seat and make sure you know how to install it in your car. ? Pack your hospital bag. ? Prepare the baby's nursery. Make sure to remove all pillows and stuffed animals from the baby's crib to prevent suffocation.  Visit your dentist if you have not gone during your pregnancy. Use a soft toothbrush to brush your teeth and be gentle when you floss. Contact a health care provider if:  You are unsure if you are in labor or if your water has broken.  You become dizzy.  You have mild pelvic cramps, pelvic pressure, or nagging pain in your abdominal area.  You have lower back pain.  You have persistent  nausea, vomiting, or diarrhea.  You have an unusual or bad smelling vaginal discharge.  You have pain when you urinate. Get help right away if:  Your water breaks before 37 weeks.  You have regular contractions less than 5 minutes apart before 37 weeks.  You have a fever.  You are leaking fluid from your vagina.  You have spotting or bleeding from your vagina.  You have severe abdominal pain or cramping.  You have rapid weight loss or weight gain.    You have shortness of breath with chest pain.  You notice sudden or extreme swelling of your face, hands, ankles, feet, or legs.  Your baby makes fewer than 10 movements in 2 hours.  You have severe headaches that do not go away when you take medicine.  You have vision changes. Summary  The third trimester is from week 28 through week 40, months 7 through 9. The third trimester is a time when the unborn baby (fetus) is growing rapidly.  During the third trimester, your discomfort may increase as you and your baby continue to gain weight. You may have abdominal, leg, and back pain, sleeping problems, and an increased need to urinate.  During the third trimester your breasts will keep growing and they will continue to become tender. A yellow fluid (colostrum) may leak from your breasts. This is the first milk you are producing for your baby.  False labor is a condition in which you feel small, irregular tightenings of the muscles in the womb (contractions) that eventually go away. These are called Braxton Hicks contractions. Contractions may last for hours, days, or even weeks before true labor sets in.  Signs of labor can include: abdominal cramps; regular contractions that start at 10 minutes apart and become stronger and more frequent with time; watery or bloody mucus discharge that comes from the vagina; increased pelvic pressure and dull back pain; and leaking of amniotic fluid. This information is not intended to replace advice  given to you by your health care provider. Make sure you discuss any questions you have with your health care provider. Document Released: 01/07/2001 Document Revised: 06/21/2015 Document Reviewed: 03/16/2012 Elsevier Interactive Patient Education  2017 [employer]  

## 2016-04-17 ENCOUNTER — Ambulatory Visit (INDEPENDENT_AMBULATORY_CARE_PROVIDER_SITE_OTHER): Payer: Medicaid Other | Admitting: Family Medicine

## 2016-04-17 VITALS — BP 116/72 | HR 76 | Wt 176.0 lb

## 2016-04-17 DIAGNOSIS — Z2839 Other underimmunization status: Secondary | ICD-10-CM

## 2016-04-17 DIAGNOSIS — O09899 Supervision of other high risk pregnancies, unspecified trimester: Secondary | ICD-10-CM

## 2016-04-17 DIAGNOSIS — Z283 Underimmunization status: Secondary | ICD-10-CM

## 2016-04-17 DIAGNOSIS — O9989 Other specified diseases and conditions complicating pregnancy, childbirth and the puerperium: Secondary | ICD-10-CM

## 2016-04-17 DIAGNOSIS — Z3403 Encounter for supervision of normal first pregnancy, third trimester: Secondary | ICD-10-CM

## 2016-04-17 NOTE — Progress Notes (Signed)
   PRENATAL VISIT NOTE  Subjective:  [name] is a 21 y.o. G1P0 at 44w6dbeing seen today for ongoing prenatal care.  She is currently monitored for the following issues for this low-risk pregnancy and has Supervision of normal pregnancy and Rubella non-immune status, antepartum on her problem list.  Patient reports no complaints.  Contractions: Irritability. Vag. Bleeding: None.  Movement: Present. Denies leaking of fluid.   The following portions of the patient's history were reviewed and updated as appropriate: allergies, current medications, past family history, past medical history, past social history, past surgical history and problem list. Problem list updated.  Objective:   Vitals:   04/17/16 0819  BP: 116/72  Pulse: 76  Weight: 176 lb (79.8 kg)    Fetal Status: Fetal Heart Rate (bpm): 130 Fundal Height: 37 cm Movement: Present     General:  Alert, oriented and cooperative. Patient is in no acute distress.  Skin: Skin is warm and dry. No rash noted.   Cardiovascular: Normal heart rate noted  Respiratory: Normal respiratory effort, no problems with respiration noted  Abdomen: Soft, gravid, appropriate for gestational age. Pain/Pressure: Absent     Pelvic:  Cervical exam deferred        Extremities: Normal range of motion.  Edema: None  Mental Status: Normal mood and affect. Normal behavior. Normal judgment and thought content.   Assessment and Plan:  Pregnancy: G1P0 at 377w6d1. Encounter for supervision of normal first pregnancy in third trimester FHT and FH normal  2. Rubella non-immune status, antepartum MMR postpartum  Term labor symptoms and general obstetric precautions including but not limited to vaginal bleeding, contractions, leaking of fluid and fetal movement were reviewed in detail with the patient. Please refer to After Visit Summary for other counseling recommendations.  Return in about 1 week (around 04/24/2016) for OB f/u.   JaTruett Mainland DO

## 2016-04-23 ENCOUNTER — Ambulatory Visit (INDEPENDENT_AMBULATORY_CARE_PROVIDER_SITE_OTHER): Payer: Medicaid Other | Admitting: Obstetrics & Gynecology

## 2016-04-23 VITALS — BP 128/77 | HR 102 | Wt 175.0 lb

## 2016-04-23 DIAGNOSIS — Z3403 Encounter for supervision of normal first pregnancy, third trimester: Secondary | ICD-10-CM

## 2016-04-23 NOTE — Progress Notes (Signed)
   PRENATAL VISIT NOTE  Subjective:  [name] is a 21 y.o. G1P0 at 1175w5d being seen today for ongoing prenatal care.  She is currently monitored for the following issues for this low-risk pregnancy and has Supervision of normal pregnancy and Rubella non-immune status, antepartum on her problem list.  Patient reports no complaints.  Contractions: Irregular. Vag. Bleeding: None.  Movement: Present. Denies leaking of fluid.   The following portions of the patient's history were reviewed and updated as appropriate: allergies, current medications, past family history, past medical history, past social history, past surgical history and problem list. Problem list updated.  Objective:   Vitals:   04/23/16 1350  BP: 128/77  Pulse: (!) 102  Weight: 175 lb (79.4 kg)    Fetal Status: Fetal Heart Rate (bpm): 130 Fundal Height: 39 cm Movement: Present  Presentation: Vertex  General:  Alert, oriented and cooperative. Patient is in no acute distress.  Skin: Skin is warm and dry. No rash noted.   Cardiovascular: Normal heart rate noted  Respiratory: Normal respiratory effort, no problems with respiration noted  Abdomen: Soft, gravid, appropriate for gestational age. Pain/Pressure: Present     Pelvic:  Cervical exam performed Dilation: Closed Effacement (%): Thick Station: Ballotable  Extremities: Normal range of motion.  Edema: None  Mental Status: Normal mood and affect. Normal behavior. Normal judgment and thought content.   Assessment and Plan:  Pregnancy: G1P0 at 1175w5d  1. Encounter for supervision of normal first pregnancy in third trimester Term labor symptoms and general obstetric precautions including but not limited to vaginal bleeding, contractions, leaking of fluid and fetal movement were reviewed in detail with the patient. Please refer to After Visit Summary for other counseling recommendations.  Return in about 1 week (around 04/30/2016) for OB Visit    05/05/16: OB visit, NST    05/08/16 NST and AFI   [facility]   Tereso NewcomerUgonna A Posey Petrik, MD

## 2016-04-23 NOTE — Patient Instructions (Addendum)
Return to clinic for any scheduled appointments or obstetric concerns, or go to [facility] for evaluation    Labor Induction (Scheduled 05/10/16 at 7 am at [facility]) Labor induction is when steps are taken to cause a pregnant woman to begin the labor process. Most women go into labor on their own between 37 weeks and 42 weeks of the pregnancy. When this does not happen or when there is a medical need, methods may be used to induce labor. Labor induction causes a pregnant woman's uterus to contract. It also causes the cervix to soften (ripen), open (dilate), and thin out (efface). Usually, labor is not induced before 39 weeks of the pregnancy unless there is a problem with the baby or mother. Before inducing labor, your health care provider will consider a number of factors, including the following:  The medical condition of you and the baby.  How many weeks along you are.  The status of the baby's lung maturity.  The condition of the cervix.  The position of the baby. What are the reasons for labor induction? Labor may be induced for the following reasons:  The health of the baby or mother is at risk.  The pregnancy is overdue by 1 week or more.  The water breaks but labor does not start on its own.  The mother has a health condition or serious illness, such as high blood pressure, infection, placental abruption, or diabetes.  The amniotic fluid amounts are low around the baby.  The baby is distressed. Convenience or wanting the baby to be born on a certain date is not a reason for inducing labor. What methods are used for labor induction? Several methods of labor induction may be used, such as:  Prostaglandin medicine. This medicine causes the cervix to dilate and ripen. The medicine will also start contractions. It can be taken by mouth or by inserting a suppository into the vagina.  Inserting a thin tube (catheter) with a balloon on the end into the vagina to dilate the  cervix. Once inserted, the balloon is expanded with water, which causes the cervix to open.  Stripping the membranes. Your health care provider separates amniotic sac tissue from the cervix, causing the cervix to be stretched and causing the release of a hormone called progesterone. This may cause the uterus to contract. It is often done during an office visit. You will be sent home to wait for the contractions to begin. You will then come in for an induction.  Breaking the water. Your health care provider makes a hole in the amniotic sac using a small instrument. Once the amniotic sac breaks, contractions should begin. This may still take hours to see an effect.  Medicine to trigger or strengthen contractions. This medicine is given through an IV access tube inserted into a vein in your arm. All of the methods of induction, besides stripping the membranes, will be done in the hospital. Induction is done in the hospital so that you and the baby can be carefully monitored. How long does it take for labor to be induced? Some inductions can take up to 2-3 days. Depending on the cervix, it usually takes less time. It takes longer when you are induced early in the pregnancy or if this is your first pregnancy. If a mother is still pregnant and the induction has been going on for 2-3 days, either the mother will be sent home or a cesarean delivery will be needed. What are the risks associated with  labor induction? Some of the risks of induction include:  Changes in fetal heart rate, such as too high, too low, or erratic.  Fetal distress.  Chance of infection for the mother and baby.  Increased chance of having a cesarean delivery.  Breaking off (abruption) of the placenta from the uterus (rare).  Uterine rupture (very rare). When induction is needed for medical reasons, the benefits of induction may outweigh the risks. What are some reasons for not inducing labor? Labor induction should not be  done if:  It is shown that your baby does not tolerate labor.  You have had previous surgeries on your uterus, such as a myomectomy or the removal of fibroids.  Your placenta lies very low in the uterus and blocks the opening of the cervix (placenta previa).  Your baby is not in a head-down position.  The umbilical cord drops down into the birth canal in front of the baby. This could cut off the baby's blood and oxygen supply.  You have had a previous cesarean delivery.  There are unusual circumstances, such as the baby being extremely premature. This information is not intended to replace advice given to you by your health care provider. Make sure you discuss any questions you have with your health care provider. Document Released: 06/04/2006 Document Revised: 06/21/2015 Document Reviewed: 08/12/2012 Elsevier Interactive Patient Education  2017 [employer].

## 2016-04-30 ENCOUNTER — Encounter: Payer: Self-pay | Admitting: Obstetrics & Gynecology

## 2016-05-01 ENCOUNTER — Ambulatory Visit (INDEPENDENT_AMBULATORY_CARE_PROVIDER_SITE_OTHER): Payer: Medicaid Other | Admitting: Family Medicine

## 2016-05-01 VITALS — BP 123/80 | HR 97 | Wt 176.0 lb

## 2016-05-01 DIAGNOSIS — Z3403 Encounter for supervision of normal first pregnancy, third trimester: Secondary | ICD-10-CM

## 2016-05-01 NOTE — Progress Notes (Signed)
   PRENATAL VISIT NOTE  Subjective:  [name] is a 21 y.o. G1P0 at [redacted]w[redacted]d being seen today for ongoing prenatal care.  She is currently monitored for the following issues for this low-risk pregnancy and has Supervision of normal pregnancy and Rubella non-immune status, antepartum on her problem list.  Patient reports no complaints.  Contractions: Irregular. Vag. Bleeding: None.  Movement: Present. Denies leaking of fluid.   The following portions of the patient's history were reviewed and updated as appropriate: allergies, current medications, past family history, past medical history, past social history, past surgical history and problem list. Problem list updated.  Objective:   Vitals:   05/01/16 1001  BP: 123/80  Pulse: 97  Weight: 176 lb (79.8 kg)    Fetal Status: Fetal Heart Rate (bpm): 133 Fundal Height: 39 cm Movement: Present  Presentation: Vertex  General:  Alert, oriented and cooperative. Patient is in no acute distress.  Skin: Skin is warm and dry. No rash noted.   Cardiovascular: Normal heart rate noted  Respiratory: Normal respiratory effort, no problems with respiration noted  Abdomen: Soft, gravid, appropriate for gestational age. Pain/Pressure: Present     Pelvic:  Cervical exam performed Dilation: Fingertip Effacement (%): 30 Station: Ballotable  Extremities: Normal range of motion.  Edema: Trace  Mental Status: Normal mood and affect. Normal behavior. Normal judgment and thought content.   Assessment and Plan:  Pregnancy: G1P0 at [redacted]w[redacted]d  1. Encounter for supervision of normal first pregnancy in third trimester FHT and FH normal.   Term labor symptoms and general obstetric precautions including but not limited to vaginal bleeding, contractions, leaking of fluid and fetal movement were reviewed in detail with the patient. Please refer to After Visit Summary for other counseling recommendations.  No Follow-up on file.   Levie Heritage, DO

## 2016-05-05 ENCOUNTER — Ambulatory Visit (INDEPENDENT_AMBULATORY_CARE_PROVIDER_SITE_OTHER): Payer: Medicaid Other | Admitting: Obstetrics & Gynecology

## 2016-05-05 VITALS — BP 113/82 | HR 107 | Wt 179.0 lb

## 2016-05-05 DIAGNOSIS — Z283 Underimmunization status: Secondary | ICD-10-CM

## 2016-05-05 DIAGNOSIS — Z3403 Encounter for supervision of normal first pregnancy, third trimester: Secondary | ICD-10-CM

## 2016-05-05 DIAGNOSIS — O9989 Other specified diseases and conditions complicating pregnancy, childbirth and the puerperium: Secondary | ICD-10-CM

## 2016-05-05 DIAGNOSIS — O09899 Supervision of other high risk pregnancies, unspecified trimester: Secondary | ICD-10-CM

## 2016-05-05 DIAGNOSIS — O480 Post-term pregnancy: Secondary | ICD-10-CM

## 2016-05-05 DIAGNOSIS — Z2839 Other underimmunization status: Secondary | ICD-10-CM

## 2016-05-05 NOTE — Progress Notes (Signed)
   PRENATAL VISIT NOTE  Subjective:  [name] is a 21 y.o. G1P0 at [redacted]w[redacted]d being seen today for ongoing prenatal care.  She is currently monitored for the following issues for this low-risk pregnancy and has Supervision of normal pregnancy and Rubella non-immune status, antepartum on her problem list.  Patient reports no complaints.  Contractions: Irregular. Vag. Bleeding: None.  Movement: Present. Denies leaking of fluid.   The following portions of the patient's history were reviewed and updated as appropriate: allergies, current medications, past family history, past medical history, past social history, past surgical history and problem list. Problem list updated.  Objective:   Vitals:   05/05/16 0949  BP: (!) 130/100  Pulse: (!) 107  Weight: 179 lb (81.2 kg)    Fetal Status:     Movement: Present     General:  Alert, oriented and cooperative. Patient is in no acute distress.  Skin: Skin is warm and dry. No rash noted.   Cardiovascular: Normal heart rate noted  Respiratory: Normal respiratory effort, no problems with respiration noted  Abdomen: Soft, gravid, appropriate for gestational age. Pain/Pressure: Present     Pelvic:  Cervical exam performed        Extremities: Normal range of motion.  Edema: Trace  Mental Status: Normal mood and affect. Normal behavior. Normal judgment and thought content.   Assessment and Plan:  Pregnancy: G1P0 at [redacted]w[redacted]d  1. Encounter for supervision of normal first pregnancy in third trimester  - Fetal nonstress test NST reviewed and reactive.  Needs AFI on her next visit  Scheduled for IOL 05/10/2016  2. Rubella non-immune status, antepartum   Term labor symptoms and general obstetric precautions including but not limited to vaginal bleeding, contractions, leaking of fluid and fetal movement were reviewed in detail with the patient. Please refer to After Visit Summary for other counseling recommendations.  Pt will f/u for NST with AFI in  3 days and then if undelivered will f/u for IOL 05/10/16 Return in about 6 weeks (around 06/16/2016). for PP visit  Willodean Rosenthal, MD

## 2016-05-05 NOTE — Patient Instructions (Signed)
Labor Induction Labor induction is when steps are taken to cause a pregnant woman to begin the labor process. Most women go into labor on their own between 37 weeks and 42 weeks of the pregnancy. When this does not happen or when there is a medical need, methods may be used to induce labor. Labor induction causes a pregnant woman's uterus to contract. It also causes the cervix to soften (ripen), open (dilate), and thin out (efface). Usually, labor is not induced before 39 weeks of the pregnancy unless there is a problem with the baby or mother. Before inducing labor, your health care provider will consider a number of factors, including the following:  The medical condition of you and the baby.  How many weeks along you are.  The status of the baby's lung maturity.  The condition of the cervix.  The position of the baby. What are the reasons for labor induction? Labor may be induced for the following reasons:  The health of the baby or mother is at risk.  The pregnancy is overdue by 1 week or more.  The water breaks but labor does not start on its own.  The mother has a health condition or serious illness, such as high blood pressure, infection, placental abruption, or diabetes.  The amniotic fluid amounts are low around the baby.  The baby is distressed. Convenience or wanting the baby to be born on a certain date is not a reason for inducing labor. What methods are used for labor induction? Several methods of labor induction may be used, such as:  Prostaglandin medicine. This medicine causes the cervix to dilate and ripen. The medicine will also start contractions. It can be taken by mouth or by inserting a suppository into the vagina.  Inserting a thin tube (catheter) with a balloon on the end into the vagina to dilate the cervix. Once inserted, the balloon is expanded with water, which causes the cervix to open.  Stripping the membranes. Your health care provider separates  amniotic sac tissue from the cervix, causing the cervix to be stretched and causing the release of a hormone called progesterone. This may cause the uterus to contract. It is often done during an office visit. You will be sent home to wait for the contractions to begin. You will then come in for an induction.  Breaking the water. Your health care provider makes a hole in the amniotic sac using a small instrument. Once the amniotic sac breaks, contractions should begin. This may still take hours to see an effect.  Medicine to trigger or strengthen contractions. This medicine is given through an IV access tube inserted into a vein in your arm. All of the methods of induction, besides stripping the membranes, will be done in the hospital. Induction is done in the hospital so that you and the baby can be carefully monitored. How long does it take for labor to be induced? Some inductions can take up to 2-3 days. Depending on the cervix, it usually takes less time. It takes longer when you are induced early in the pregnancy or if this is your first pregnancy. If a mother is still pregnant and the induction has been going on for 2-3 days, either the mother will be sent home or a cesarean delivery will be needed. What are the risks associated with labor induction? Some of the risks of induction include:  Changes in fetal heart rate, such as too high, too low, or erratic.  Fetal distress.    Chance of infection for the mother and baby.  Increased chance of having a cesarean delivery.  Breaking off (abruption) of the placenta from the uterus (rare).  Uterine rupture (very rare). When induction is needed for medical reasons, the benefits of induction may outweigh the risks. What are some reasons for not inducing labor? Labor induction should not be done if:  It is shown that your baby does not tolerate labor.  You have had previous surgeries on your uterus, such as a myomectomy or the removal of  fibroids.  Your placenta lies very low in the uterus and blocks the opening of the cervix (placenta previa).  Your baby is not in a head-down position.  The umbilical cord drops down into the birth canal in front of the baby. This could cut off the baby's blood and oxygen supply.  You have had a previous cesarean delivery.  There are unusual circumstances, such as the baby being extremely premature. This information is not intended to replace advice given to you by your health care provider. Make sure you discuss any questions you have with your health care provider. Document Released: 06/04/2006 Document Revised: 06/21/2015 Document Reviewed: 08/12/2012 Elsevier Interactive Patient Education  2017 [employer]  

## 2016-05-06 ENCOUNTER — Telehealth (HOSPITAL_COMMUNITY): Payer: Self-pay | Admitting: *Deleted

## 2016-05-06 ENCOUNTER — Encounter (HOSPITAL_COMMUNITY): Payer: Self-pay | Admitting: *Deleted

## 2016-05-06 NOTE — Telephone Encounter (Signed)
Preadmission screen  

## 2016-05-08 ENCOUNTER — Encounter (HOSPITAL_COMMUNITY): Payer: Self-pay

## 2016-05-08 ENCOUNTER — Inpatient Hospital Stay (HOSPITAL_COMMUNITY)
Admission: AD | Admit: 2016-05-08 | Discharge: 2016-05-11 | DRG: 775 | Disposition: A | Discharge status: Home / Self Care | Payer: Medicaid Other | Source: Ambulatory Visit | Attending: Obstetrics & Gynecology | Admitting: Obstetrics & Gynecology

## 2016-05-08 ENCOUNTER — Ambulatory Visit (INDEPENDENT_AMBULATORY_CARE_PROVIDER_SITE_OTHER): Payer: Medicaid Other

## 2016-05-08 VITALS — BP 117/72 | HR 86

## 2016-05-08 DIAGNOSIS — O4202 Full-term premature rupture of membranes, onset of labor within 24 hours of rupture: Secondary | ICD-10-CM | POA: Diagnosis present

## 2016-05-08 DIAGNOSIS — Z3403 Encounter for supervision of normal first pregnancy, third trimester: Secondary | ICD-10-CM

## 2016-05-08 DIAGNOSIS — O9989 Other specified diseases and conditions complicating pregnancy, childbirth and the puerperium: Secondary | ICD-10-CM

## 2016-05-08 DIAGNOSIS — Z2839 Other underimmunization status: Secondary | ICD-10-CM

## 2016-05-08 DIAGNOSIS — Z283 Underimmunization status: Secondary | ICD-10-CM

## 2016-05-08 DIAGNOSIS — Z3A41 41 weeks gestation of pregnancy: Secondary | ICD-10-CM | POA: Diagnosis not present

## 2016-05-08 DIAGNOSIS — Z3A40 40 weeks gestation of pregnancy: Secondary | ICD-10-CM

## 2016-05-08 DIAGNOSIS — O480 Post-term pregnancy: Secondary | ICD-10-CM | POA: Diagnosis present

## 2016-05-08 LAB — CBC
HCT: 34.8 % — ABNORMAL LOW (ref 36.0–46.0)
Hemoglobin: 10.9 g/dL — ABNORMAL LOW (ref 12.0–15.0)
MCH: 24.6 pg — AB (ref 26.0–34.0)
MCHC: 31.3 g/dL (ref 30.0–36.0)
MCV: 78.6 fL (ref 78.0–100.0)
PLATELETS: 191 10*3/uL (ref 150–400)
RBC: 4.43 MIL/uL (ref 3.87–5.11)
RDW: 17.6 % — AB (ref 11.5–15.5)
WBC: 8.7 10*3/uL (ref 4.0–10.5)

## 2016-05-08 LAB — TYPE AND SCREEN
ABO/RH(D): A POS
Antibody Screen: NEGATIVE

## 2016-05-08 LAB — ABO/RH: ABO/RH(D): A POS

## 2016-05-08 MED ORDER — OXYCODONE-ACETAMINOPHEN 5-325 MG PO TABS: 1.0000 | ORAL_TABLET | ORAL | Status: DC | PRN

## 2016-05-08 MED ORDER — SOD CITRATE-CITRIC ACID 500-334 MG/5ML PO SOLN: 30.0000 mL | ORAL | Status: DC | PRN

## 2016-05-08 MED ORDER — TERBUTALINE SULFATE 1 MG/ML IJ SOLN
0.2500 mg | Freq: Once | INTRAMUSCULAR | Status: DC | PRN
  Filled 2016-05-08: qty 1

## 2016-05-08 MED ORDER — OXYTOCIN 40 UNITS IN LACTATED RINGERS INFUSION - SIMPLE MED: 2.5000 [IU]/h | INTRAVENOUS | Status: DC

## 2016-05-08 MED ORDER — FENTANYL CITRATE (PF) 100 MCG/2ML IJ SOLN
100.0000 ug | INTRAMUSCULAR | Status: DC | PRN
  Administered 2016-05-08 – 2016-05-09 (×2): 100 ug via INTRAVENOUS
  Filled 2016-05-08 (×2): qty 2

## 2016-05-08 MED ORDER — FLEET ENEMA 7-19 GM/118ML RE ENEM: 1.0000 | ENEMA | RECTAL | Status: DC | PRN

## 2016-05-08 MED ORDER — OXYTOCIN 40 UNITS IN LACTATED RINGERS INFUSION - SIMPLE MED
1.0000 m[IU]/min | INTRAVENOUS | Status: DC
  Administered 2016-05-08: 2 m[IU]/min via INTRAVENOUS
  Filled 2016-05-08: qty 1000

## 2016-05-08 MED ORDER — LIDOCAINE HCL (PF) 1 % IJ SOLN
30.0000 mL | INTRAMUSCULAR | Status: DC | PRN
  Filled 2016-05-08: qty 30

## 2016-05-08 MED ORDER — OXYTOCIN BOLUS FROM INFUSION
500.0000 mL | Freq: Once | INTRAVENOUS | Status: AC
  Administered 2016-05-09: 500 mL via INTRAVENOUS

## 2016-05-08 MED ORDER — MISOPROSTOL 25 MCG QUARTER TABLET
25.0000 ug | ORAL_TABLET | ORAL | Status: DC | PRN
  Administered 2016-05-08 (×2): 25 ug via VAGINAL
  Filled 2016-05-08 (×3): qty 1

## 2016-05-08 MED ORDER — LACTATED RINGERS IV SOLN
INTRAVENOUS | Status: DC
  Administered 2016-05-08 (×2): via INTRAVENOUS

## 2016-05-08 MED ORDER — LACTATED RINGERS IV SOLN
500.0000 mL | INTRAVENOUS | Status: DC | PRN
  Administered 2016-05-09: 500 mL via INTRAVENOUS
  Administered 2016-05-09: 300 mL via INTRAVENOUS

## 2016-05-08 MED ORDER — ONDANSETRON HCL 4 MG/2ML IJ SOLN
4.0000 mg | Freq: Four times a day (QID) | INTRAMUSCULAR | Status: DC | PRN
  Administered 2016-05-09: 4 mg via INTRAVENOUS
  Filled 2016-05-08: qty 2

## 2016-05-08 MED ORDER — OXYCODONE-ACETAMINOPHEN 5-325 MG PO TABS: 2.0000 | ORAL_TABLET | ORAL | Status: DC | PRN

## 2016-05-08 MED ORDER — ACETAMINOPHEN 325 MG PO TABS: 650.0000 mg | ORAL_TABLET | ORAL | Status: DC | PRN

## 2016-05-08 NOTE — H&P (Signed)
I confirm that I have verified the information documented in the resident's note and that I have also personally reperformed the physical exam and all medical decision making activities.  [name], CNM 05/08/2016 4:51 PM   OBSTETRIC ADMISSION HISTORY AND PHYSICAL  [name] is a 21 y.o. female G1P0 with IUP at 76w6dby LMP presenting for IOL due to nonreassuring NST. She reports +FMs, No LOF, no VB, no blurry vision, headaches or peripheral edema, and RUQ pain.  She plans on breast feeding. She request IUD for birth control.  Dating: By LMP c/w 19 week US--->  Estimated Date of Delivery: 05/02/16  Prenatal History/Complications:  Past Medical History: Past Medical History:  Diagnosis Date  . Eczema     Past Surgical History: Past Surgical History:  Procedure Laterality Date  . ovary removed      Obstetrical History: OB History    Gravida Para Term Preterm AB Living   1             SAB TAB Ectopic Multiple Live Births                  Social History: Social History   Social History  . Marital status: Single    Spouse name: N/A  . Number of children: N/A  . Years of education: N/A   Social History Main Topics  . Smoking status: Never Smoker  . Smokeless tobacco: Never Used  . Alcohol use No  . Drug use: No  . Sexual activity: Yes    Birth control/ protection: None   Other Topics Concern  . None   Social History Narrative  . None    Family History: Family History  Problem Relation Age of Onset  . Aneurysm Mother   . Fibroids Mother     Allergies: No Known Allergies  Facility-Administered Medications Prior to Admission  Medication Dose Route Frequency Provider Last Rate Last Dose  . Tdap (BOOSTRIX) injection 0.5 mL  0.5 mL Intramuscular Once CLavonia Drafts MD       Prescriptions Prior to Admission  Medication Sig Dispense Refill Last Dose  . ondansetron (ZOFRAN-ODT) 4 MG disintegrating tablet Take 1 tablet (4 mg total) by  mouth every 8 (eight) hours as needed for nausea. (Patient not taking: Reported on 04/10/2016) 6 tablet 0 Not Taking  . Prenatal Vit-Fe Fumarate-FA (PREPLUS) 27-1 MG TABS Take 1 tablet by mouth daily. 30 tablet 13 Taking     Review of Systems   All systems reviewed and negative except as stated in HPI  Blood pressure (!) 143/80, pulse 87, temperature 98.7 F (37.1 C), temperature source Oral, resp. rate 16, height _0  (1.626 m), weight 81.6 kg (180 lb), last menstrual period 07/27/2015. General appearance: alert, cooperative, appears stated age and no distress Lungs: clear to auscultation bilaterally Heart: regular rate and rhythm Abdomen: soft, non-tender; bowel sounds normal Pelvic:  1/thick/-3 per nursing Extremities: Homans sign is negative, no sign of DVT Presentation: cephalic Fetal monitoringBaseline: 130 bpm Uterine activityFrequency: Every 3-6 minutes     Prenatal labs: ABO, Rh: A/Positive/-- (08/25 0000) Antibody: Negative (08/25 0000) Rubella: non immune RPR: Nonreactive (08/25 0000)  HBsAg: Negative (08/25 0000)  HIV: Non-reactive (08/25 0000)  GBS: Negative (03/09 1034)  1 hr Glucola 3rd trimester, wnl Genetic screening  Quad negative Anatomy [place]_1 , wnl  Prenatal Transfer Tool  Maternal Diabetes: No Genetic Screening: Normal Maternal Ultrasounds/Referrals: Normal Fetal Ultrasounds or other Referrals:  None Maternal Substance Abuse:  No Significant Maternal Medications:  None Significant Maternal Lab Results: Lab values include: Group B Strep negative  No results found for this or any previous visit (from the past 24 hour(s)).  Patient Active Problem List   Diagnosis Date Noted  . Post-dates pregnancy 05/08/2016  . Rubella non-immune status, antepartum 01/24/2016  . Supervision of normal pregnancy 11/15/2015    Assessment: [name] is a 21 y.o. G1P0 at 79w6dhere for IOL for nonreassuring NST postdates.  #Labor:IOL, cytotec 253m vaginally  q4h #Pain: Epidural upon maternal request and >3cm #FWB: Category 1 tracing; no 15x15 accels #ID:  GBS Negative; Rubella nonimmune-will need PP MMR #MOF: breast #MOC:IUD #Circ:  yes  ReFrancene BoyersMD 05/08/2016, 1:03 PM

## 2016-05-08 NOTE — Anesthesia Pain Management Evaluation Note (Signed)
  CRNA Pain Management Visit Note  Patient: [name], 21 y.o., female  "Hello I am a member of the anesthesia team at [facility]. We have an anesthesia team available at all times to provide care throughout the hospital, including epidural management and anesthesia for C-section. I don't know your plan for the delivery whether it a natural birth, water birth, IV sedation, nitrous supplementation, doula or epidural, but we want to meet your pain goals."   1.Was your pain managed to your expectations on prior hospitalizations?   No prior hospitalizations  2.What is your expectation for pain management during this hospitalization?     Labor support without medications, Epidural, IV pain meds and Nitrous Oxide  3.How can we help you reach that goal? This the Patient's first pregnancy and she is unsure of her method of pain management. She open to discussion of them all. Questions were answered.  Record the patient's initial score and the patient's pain goal.   Pain: 3  Pain Goal: 10 [facility] wants you to be able to say your pain was always managed very well.  Vianca Bracher 05/08/2016

## 2016-05-08 NOTE — Progress Notes (Signed)
Patient had non reactive NST and review by Dr Adrian Blackwater who call [facility] to send patient over for induction of labor. Armandina Stammer RNBSN

## 2016-05-08 NOTE — Progress Notes (Signed)
Foley fell out, cx now 4/thick. Ctx q 1-2 minutes, mild to moderate. IV pain meds helping. FHR cat 1. Will augment w/pitocin if ctx dont' increase in intensity and/or decrease in frequency.

## 2016-05-08 NOTE — Progress Notes (Signed)
Labor Progress Note  S: Patient not feeling any contractions. No complaints. Agreeable to foley bulb placement  O:  BP 113/77   Pulse 91   Temp 98.1 F (36.7 C) (Axillary)   Resp 16   Ht  (1.626 m)   Wt 81.6 kg (180 lb)   LMP 07/27/2015   BMI 30.90 kg/m  EFM: 125, mod var, no 15x15 accels, no decels [id number]: 1 Effacement (%): Thick Station: -3 Presentation: Vertex Exam by:: dr Hillis Range   A&P: 21 y.o. G1P0 [redacted]w[redacted]d IOL for postdates and nonreassuring NST  #Labor:2nd cytotec placed, foley bulb placed #FWB: Category 1 tracing #GBS: negative  Durenda Hurt, MD Resident Physician 5:54 PM

## 2016-05-09 ENCOUNTER — Encounter (HOSPITAL_COMMUNITY): Payer: Self-pay

## 2016-05-09 ENCOUNTER — Inpatient Hospital Stay (HOSPITAL_COMMUNITY): Payer: Medicaid Other | Admitting: Anesthesiology

## 2016-05-09 DIAGNOSIS — Z3A41 41 weeks gestation of pregnancy: Secondary | ICD-10-CM

## 2016-05-09 DIAGNOSIS — O480 Post-term pregnancy: Secondary | ICD-10-CM

## 2016-05-09 LAB — RPR: RPR Ser Ql: NONREACTIVE

## 2016-05-09 MED ORDER — SENNOSIDES-DOCUSATE SODIUM 8.6-50 MG PO TABS
2.0000 | ORAL_TABLET | ORAL | Status: DC
  Administered 2016-05-09 – 2016-05-10 (×2): 2 via ORAL
  Filled 2016-05-09 (×2): qty 2

## 2016-05-09 MED ORDER — DIPHENHYDRAMINE HCL 50 MG/ML IJ SOLN: 12.5000 mg | INTRAMUSCULAR | Status: DC | PRN

## 2016-05-09 MED ORDER — LIDOCAINE HCL (PF) 1 % IJ SOLN
INTRAMUSCULAR | Status: DC | PRN
  Administered 2016-05-09: 13 mL via EPIDURAL

## 2016-05-09 MED ORDER — FENTANYL 2.5 MCG/ML BUPIVACAINE 1/10 % EPIDURAL INFUSION (WH - ANES)
14.0000 mL/h | INTRAMUSCULAR | Status: DC | PRN
  Administered 2016-05-09 (×2): 14 mL/h via EPIDURAL
  Filled 2016-05-09: qty 100

## 2016-05-09 MED ORDER — IBUPROFEN 600 MG PO TABS
600.0000 mg | ORAL_TABLET | Freq: Four times a day (QID) | ORAL | Status: DC
  Administered 2016-05-09 – 2016-05-11 (×8): 600 mg via ORAL
  Filled 2016-05-09 (×9): qty 1

## 2016-05-09 MED ORDER — BENZOCAINE-MENTHOL 20-0.5 % EX AERO: 1.0000 "application " | INHALATION_SPRAY | CUTANEOUS | Status: DC | PRN

## 2016-05-09 MED ORDER — DIPHENHYDRAMINE HCL 25 MG PO CAPS: 25.0000 mg | ORAL_CAPSULE | Freq: Four times a day (QID) | ORAL | Status: DC | PRN

## 2016-05-09 MED ORDER — DIBUCAINE 1 % RE OINT: 1.0000 "application " | TOPICAL_OINTMENT | RECTAL | Status: DC | PRN

## 2016-05-09 MED ORDER — LACTATED RINGERS IV SOLN: 500.0000 mL | Freq: Once | INTRAVENOUS | Status: DC

## 2016-05-09 MED ORDER — COCONUT OIL OIL: 1.0000 "application " | TOPICAL_OIL | Status: DC | PRN

## 2016-05-09 MED ORDER — FENTANYL 2.5 MCG/ML BUPIVACAINE 1/10 % EPIDURAL INFUSION (WH - ANES)
INTRAMUSCULAR | Status: AC
  Filled 2016-05-09: qty 100

## 2016-05-09 MED ORDER — ONDANSETRON HCL 4 MG PO TABS: 4.0000 mg | ORAL_TABLET | ORAL | Status: DC | PRN

## 2016-05-09 MED ORDER — EPHEDRINE 5 MG/ML INJ
10.0000 mg | INTRAVENOUS | Status: DC | PRN
  Filled 2016-05-09: qty 2

## 2016-05-09 MED ORDER — PRENATAL MULTIVITAMIN CH
1.0000 | ORAL_TABLET | Freq: Every day | ORAL | Status: DC
  Administered 2016-05-10 – 2016-05-11 (×2): 1 via ORAL
  Filled 2016-05-09 (×2): qty 1

## 2016-05-09 MED ORDER — TETANUS-DIPHTH-ACELL PERTUSSIS 5-2.5-18.5 LF-MCG/0.5 IM SUSP: 0.5000 mL | Freq: Once | INTRAMUSCULAR | Status: DC

## 2016-05-09 MED ORDER — WITCH HAZEL-GLYCERIN EX PADS
1.0000 | MEDICATED_PAD | CUTANEOUS | Status: DC | PRN
Start: 2016-05-09 — End: 2016-05-11

## 2016-05-09 MED ORDER — PHENYLEPHRINE 40 MCG/ML (10ML) SYRINGE FOR IV PUSH (FOR BLOOD PRESSURE SUPPORT)
PREFILLED_SYRINGE | INTRAVENOUS | Status: AC
  Filled 2016-05-09: qty 20

## 2016-05-09 MED ORDER — SODIUM BICARBONATE 8.4 % IV SOLN
INTRAVENOUS | Status: DC | PRN
  Administered 2016-05-09: 3 mL via EPIDURAL
  Administered 2016-05-09: 5 mL via EPIDURAL

## 2016-05-09 MED ORDER — PHENYLEPHRINE 40 MCG/ML (10ML) SYRINGE FOR IV PUSH (FOR BLOOD PRESSURE SUPPORT)
80.0000 ug | PREFILLED_SYRINGE | INTRAVENOUS | Status: DC | PRN
  Filled 2016-05-09: qty 10
  Filled 2016-05-09: qty 5

## 2016-05-09 MED ORDER — PHENYLEPHRINE 40 MCG/ML (10ML) SYRINGE FOR IV PUSH (FOR BLOOD PRESSURE SUPPORT)
80.0000 ug | PREFILLED_SYRINGE | INTRAVENOUS | Status: DC | PRN
  Filled 2016-05-09: qty 5
  Filled 2016-05-09: qty 10

## 2016-05-09 MED ORDER — ZOLPIDEM TARTRATE 5 MG PO TABS: 5.0000 mg | ORAL_TABLET | Freq: Every evening | ORAL | Status: DC | PRN

## 2016-05-09 MED ORDER — ONDANSETRON HCL 4 MG/2ML IJ SOLN: 4.0000 mg | INTRAMUSCULAR | Status: DC | PRN

## 2016-05-09 MED ORDER — MEASLES, MUMPS & RUBELLA VAC ~~LOC~~ INJ
0.5000 mL | INJECTION | Freq: Once | SUBCUTANEOUS | Status: AC
  Administered 2016-05-11: 0.5 mL via SUBCUTANEOUS
  Filled 2016-05-09: qty 0.5

## 2016-05-09 MED ORDER — SIMETHICONE 80 MG PO CHEW: 80.0000 mg | CHEWABLE_TABLET | ORAL | Status: DC | PRN

## 2016-05-09 MED ORDER — ACETAMINOPHEN 325 MG PO TABS: 650.0000 mg | ORAL_TABLET | ORAL | Status: DC | PRN

## 2016-05-09 NOTE — Anesthesia Procedure Notes (Signed)
Epidural Patient location during procedure: OB Start time: 05/09/2016 3:24 AM End time: 05/09/2016 3:44 AM  Staffing Anesthesiologist: Anitra Lauth RAY Performed: anesthesiologist   Preanesthetic Checklist Completed: patient identified, site marked, surgical consent, pre-op evaluation, timeout performed, IV checked, risks and benefits discussed and monitors and equipment checked  Epidural Patient position: sitting Prep: DuraPrep Patient monitoring: heart rate, cardiac monitor, continuous pulse ox and blood pressure Approach: midline Location: L2-L3 Injection technique: LOR saline  Needle:  Needle type: Tuohy  Needle gauge: 17 G Needle length: 9 cm Needle insertion depth: 5 cm Catheter type: closed end flexible Catheter size: 20 Guage Catheter at skin depth: 8 cm Test dose: negative  Assessment Events: blood not aspirated, injection not painful, no injection resistance, negative IV test and no paresthesia  Additional Notes Reason for block:procedure for pain

## 2016-05-09 NOTE — Anesthesia Preprocedure Evaluation (Signed)
Anesthesia Evaluation  Patient identified by MRN, date of birth, ID band Patient awake    Reviewed: Allergy & Precautions, NPO status , Patient's Chart, lab work & pertinent test results  Airway Mallampati: II  TM Distance: >3 FB Neck ROM: Full    Dental no notable dental hx.    Pulmonary neg pulmonary ROS,    Pulmonary exam normal breath sounds clear to auscultation       Cardiovascular negative cardio ROS Normal cardiovascular exam Rhythm:Regular Rate:Normal     Neuro/Psych negative neurological ROS  negative psych ROS   GI/Hepatic negative GI ROS, Neg liver ROS,   Endo/Other  negative endocrine ROS  Renal/GU negative Renal ROS  negative genitourinary   Musculoskeletal negative musculoskeletal ROS (+)   Abdominal   Peds negative pediatric ROS (+)  Hematology negative hematology ROS (+)   Anesthesia Other Findings   Reproductive/Obstetrics negative OB ROS (+) Pregnancy                             Anesthesia Physical Anesthesia Plan  ASA: II  Anesthesia Plan: Epidural   Post-op Pain Management:    Induction:   Airway Management Planned:   Additional Equipment:   Intra-op Plan:   Post-operative Plan:   Informed Consent: I have reviewed the patients History and Physical, chart, labs and discussed the procedure including the risks, benefits and alternatives for the proposed anesthesia with the patient or authorized representative who has indicated his/her understanding and acceptance.   Dental advisory given  Plan Discussed with: CRNA  Anesthesia Plan Comments:         Anesthesia Quick Evaluation

## 2016-05-09 NOTE — Progress Notes (Signed)
Vitals:   05/09/16 0731 05/09/16 0801  BP: 112/61 106/73  Pulse: (!) 108 (!) 123  Resp:  16  Temp:     FHR Cat 1. Had SROM around 0500. Pitocin at 4 mu/min.  Ctx strong, q 2-3 m inutes.  Having some pressure, otherwise llikes epidural.  8-9/100/+1 per RN. Anticipate SVD soon

## 2016-05-09 NOTE — Lactation Note (Signed)
This note was copied from a baby's chart. Lactation Consultation Note  Mom reported baby was BF in [facility] but was removed from the breast after 5 minutes so that she could go to the bathroom and get ready to move to her room.  Baby was cueing at this consult so he was repositioned at the breast and latched easily. Many swallows were observed. Explained to feed with cues and to give the baby many opportunities to eat. Information given on support groups and OP services. Follow-up tomorrow or sooner if needed. Patient Name: [name] [id number] Date: 05/09/2016     Maternal Data    Feeding Feeding Type: Breast Milk Length of feed: 10 min  LATCH Score/Interventions Latch: Grasps breast easily, tongue down, lips flanged, rhythmical sucking.  Audible Swallowing: Spontaneous and intermittent  Type of Nipple: Everted at rest and after stimulation  Comfort (Breast/Nipple): Soft / non-tender     Hold (Positioning): No assistance needed to correctly position infant at breast.  LATCH Score: 10  Lactation Tools Discussed/Used     Consult Status      Soyla Dryer 05/09/2016, 1:33 PM

## 2016-05-09 NOTE — Progress Notes (Signed)
UR chart review completed.  

## 2016-05-09 NOTE — Progress Notes (Signed)
   [name] is a 21 y.o. G1P0 at [redacted]w[redacted]d  admitted for induction of labor due to NR NST .  Subjective: Contractions are getting strongert   Objective: Vitals:   05/08/16 2008 05/08/16 2037 05/08/16 2331 05/09/16 0044  BP: (!) 142/109 123/77 109/73 (!) 134/91  Pulse:  79 93 83  Resp:  Temp: 98 F (36.7 C)   98 F (36.7 C)  TempSrc: Oral   Axillary  Weight:      Height:       No intake/output data recorded.  FHT:  FHR: 140 bpm, variability: moderate,  accelerations:  Present,  decelerations:  Absent UC:   2.5-3 SVE:   Dilation: 4.5 Effacement (%): 60 Station: -3 Exam by:: Straight RN Pitocin @ 3 mu/min  Labs: Lab Results  Component Value Date   WBC 8.7 05/08/2016   HGB 10.9 (L) 05/08/2016   HCT 34.8 (L) 05/08/2016   MCV 78.6 05/08/2016   PLT 191 05/08/2016    Assessment / Plan: Induction of labor due to non-reassuring fetal testing,  progressing well on pitocin  Labor: Progressing normally Fetal Wellbeing:  Category I Pain Control:  IV pain meds Anticipated MOD:  NSVD  CRESENZO-DISHMAN,Cane Dubray 05/09/2016, 12:53 AM

## 2016-05-09 NOTE — Discharge Summary (Signed)
OB Discharge Summary     Patient Name: [name] DOB: Jun 24, 1995 MRN: [id number]  Date of admission: 05/08/2016 Delivering MD: Maryjo Rochester L   Date of discharge: 05/11/2016  Admitting diagnosis: INDUCTION Intrauterine pregnancy: [redacted]w[redacted]d     Secondary diagnosis:  Active Problems:   Rubella non-immune status, antepartum   Post-dates pregnancy   NSVD (normal spontaneous vaginal delivery)  Additional problems: none     Discharge diagnosis: Term Pregnancy Delivered                                                                                                Post partum procedures:none  Augmentation: AROM, Pitocin, Cytotec and Foley Balloon  Complications: None  Hospital course:  Induction of Labor With Vaginal Delivery   21 y.o. yo G1P1001 at [redacted]w[redacted]d was admitted to the hospital 05/08/2016 for induction of labor.  Indication for induction: Postdates.  Patient had an uncomplicated labor course as follows: Membrane Rupture Time/Date: 4:28 AM ,05/09/2016   Intrapartum Procedures: Episiotomy: None [1]                                         Lacerations:  None [1]  Patient had delivery of a Viable infant.  Information for the patient's newborn:  Melyssa, Signor [[id number]]      05/09/2016  Details of delivery can be found in separate delivery note.  Patient had a routine postpartum course. Patient is discharged home 05/11/16.  Physical exam  Vitals:   05/10/16 0210 05/10/16 0600 05/10/16 1808 05/11/16 0633  BP: 116/70 128/62 122/70 102/62  Pulse: 72 91 100 74  Resp: Temp: 98.2 F (36.8 C) 98.3 F (36.8 C) 98.4 F (36.9 C) 98.2 F (36.8 C)  TempSrc: Oral Oral  Oral  SpO2:      Weight:      Height:       General: alert, cooperative and no distress Lochia: appropriate Uterine Fundus: firm Incision: N/A DVT Evaluation: No evidence of DVT seen on physical exam. Labs: Lab Results  Component Value Date   WBC 8.7 05/08/2016   HGB 10.9 (L) 05/08/2016   HCT 34.8 (L) 05/08/2016   MCV 78.6 05/08/2016   PLT 191 05/08/2016   CMP Latest Ref Rng & Units 03/21/2016  Glucose 65 - 99 mg/dL 81  BUN 6 - 20 mg/dL 6  Creatinine 0.86 - 5.78 mg/dL 4.69(G)  Sodium 295 - 284 mmol/L 138  Potassium 3.5 - 5.1 mmol/L 3.2(L)  Chloride 101 - 111 mmol/L 110  CO2 22 - 32 mmol/L 20(L)  Calcium 8.9 - 10.3 mg/dL 1.3(K)  Total Protein 6.5 - 8.1 g/dL 6.5  Total Bilirubin 0.3 - 1.2 mg/dL 0.4  Alkaline Phos 38 - 126 U/L 95  AST 15 - 41 U/L 22  ALT 14 - 54 U/L 12(L)    Discharge instruction: per After Visit Summary and "Baby and Me Booklet".  After visit meds:  Allergies as of 05/11/2016   No Known  Allergies     Medication List    STOP taking these medications   ondansetron 4 MG disintegrating tablet Commonly known as:  ZOFRAN ODT     TAKE these medications   acetaminophen 500 MG tablet Commonly known as:  TYLENOL Take 500 mg by mouth every 6 (six) hours as needed.   calcium carbonate 500 MG chewable tablet Commonly known as:  TUMS - dosed in mg elemental calcium Chew 1 tablet by mouth daily.   ibuprofen 600 MG tablet Commonly known as:  ADVIL,MOTRIN Take 1 tablet (600 mg total) by mouth every 6 (six) hours.   PREPLUS 27-1 MG Tabs Take 1 tablet by mouth daily.       Diet: routine diet  Activity: Advance as tolerated. Pelvic rest for 6 weeks.   Outpatient follow up:6 weeks Follow up Appt: Future Appointments Date Time Provider Department Center  06/26/2016 8:30 AM Levie Heritage, DO [facility] None   Follow up Visit:No Follow-up on file.  Postpartum contraception: IUD Mirena  Newborn Data: Live born female  Birth Weight: 6 lb 14.1 oz (3121 g) APGAR: 9, 9  Baby Feeding: Breast Disposition:home with mother   05/11/2016 Almon Hercules, MD

## 2016-05-10 ENCOUNTER — Inpatient Hospital Stay (HOSPITAL_COMMUNITY)
Admission: RE | Admit: 2016-05-10 | Payer: Medicaid Other | Source: Ambulatory Visit | Admitting: Obstetrics & Gynecology

## 2016-05-10 ENCOUNTER — Encounter (HOSPITAL_COMMUNITY): Payer: Self-pay | Admitting: Advanced Practice Midwife

## 2016-05-10 NOTE — Progress Notes (Signed)
Post Partum Day 1 Subjective:  [name] is a 21 y.o. G1P1001 [redacted]w[redacted]d s/p SVD after IOL for PD.  No acute events overnight.  Pt denies problems with ambulating, voiding or po intake.  She denies nausea or vomiting.  Pain is well controlled.  Lochia Minimal.  Plan for birth control is oral progesterone-only contraceptive.  Method of Feeding: breast  Objective: Blood pressure 128/62, pulse 91, temperature 98.3 F (36.8 C), temperature source Oral, resp. rate 18, height  (1.626 m), weight 180 lb (81.6 kg), last menstrual period 07/27/2015, SpO2 100 %, unknown if currently breastfeeding.  Physical Exam:  General: alert, cooperative and no distress Lochia:normal flow Chest: normal WOB Heart: Regular rate Abdomen: +BS, soft, mild TTP (appropriate) Uterine Fundus: firm DVT Evaluation: No evidence of DVT seen on physical exam. Extremities: trace edema   Recent Labs  05/08/16 1300  HGB 10.9*  HCT 34.8*    Assessment/Plan:  ASSESSMENT: [name] is a 21 y.o. G1P1001 [redacted]w[redacted]d s/p SVD after IOL for PD  Plan for discharge tomorrow Continue routine PP care Breastfeeding support PRN  LOS: 2 days   Almon Hercules 05/10/2016, 9:48 AM

## 2016-05-10 NOTE — Lactation Note (Signed)
This note was copied from a baby's chart. Lactation Consultation Note New mom has large breast w/everted nipple. Mom BF baby when LC entered rm. In football position. Baby had wide flange and deep latch. Mom denied painful latch. Mom had baby w/blankets. Encouraged STS, no blankets. Gave positioning pointers, discussed maintaining deep latch, engorgement, assessing breast before and after feeding, and I&O.  Has mom as support person. Mom wearing supportive bra, explained importance of good supportive bra.  Encouraged mom to call for assistance. Praised mom for doing a good job. Patient Name: [name] Date: 05/10/2016 Reason for consult: Follow-up assessment   Maternal Data    Feeding Feeding Type: Breast Fed Length of feed: 15 min  LATCH Score/Interventions Latch: Grasps breast easily, tongue down, lips flanged, rhythmical sucking.  Audible Swallowing: Spontaneous and intermittent  Type of Nipple: Everted at rest and after stimulation  Comfort (Breast/Nipple): Soft / non-tender     Hold (Positioning): Assistance needed to correctly position infant at breast and maintain latch. Intervention(s): Breastfeeding basics reviewed;Support Pillows;Position options;Skin to skin  LATCH Score: 9  Lactation Tools Discussed/Used WIC Program: Yes   Consult Status Consult Status: Follow-up Date: 05/11/16 Follow-up type: In-patient    [name] 05/10/2016, 9:44 AM

## 2016-05-10 NOTE — Anesthesia Postprocedure Evaluation (Signed)
Anesthesia Post Note  Patient: [name]  Procedure(s) Performed: * No procedures listed *  Patient location during evaluation: Mother Baby Anesthesia Type: Epidural Level of consciousness: awake and alert Pain management: pain level controlled Vital Signs Assessment: post-procedure vital signs reviewed and stable Respiratory status: spontaneous breathing, nonlabored ventilation and respiratory function stable Cardiovascular status: stable Postop Assessment: no headache, no backache, epidural receding and patient able to bend at knees Anesthetic complications: no        Last Vitals:  Vitals:   05/10/16 0210 05/10/16 0600  BP: 116/70 128/62  Pulse: 72 91  Resp: 18 18  Temp: 36.8 C 36.8 C    Last Pain:  Vitals:   05/10/16 0600  TempSrc: Oral  PainSc:    Pain Goal:                 Rica Records

## 2016-05-11 MED ORDER — IBUPROFEN 600 MG PO TABS: 600.0000 mg | ORAL_TABLET | Freq: Four times a day (QID) | ORAL | 0 refills | Status: DC

## 2016-05-11 NOTE — Discharge Instructions (Signed)
Postpartum Care After Vaginal Delivery °The period of time right after you deliver your newborn is called the postpartum period. °What kind of medical care will I receive? °· You may continue to receive fluids and medicines through an IV tube inserted into one of your veins. °· If an incision was made near your vagina (episiotomy) or if you had some vaginal tearing during delivery, cold compresses may be placed on your episiotomy or your tear. This helps to reduce pain and swelling. °· You may be given a squirt bottle to use when you go to the bathroom. You may use this until you are comfortable wiping as usual. To use the squirt bottle, follow these steps: °¨ Before you urinate, fill the squirt bottle with warm water. Do not use hot water. °¨ After you urinate, while you are sitting on the toilet, use the squirt bottle to rinse the area around your urethra and vaginal opening. This rinses away any urine and blood. °¨ You may do this instead of wiping. As you start healing, you may use the squirt bottle before wiping yourself. Make sure to wipe gently. °¨ Fill the squirt bottle with clean water every time you use the bathroom. °· You will be given sanitary pads to wear. °How can I expect to feel? °· You may not feel the need to urinate for several hours after delivery. °· You will have some soreness and pain in your abdomen and vagina. °· If you are breastfeeding, you may have uterine contractions every time you breastfeed for up to several weeks postpartum. Uterine contractions help your uterus return to its normal size. °· It is normal to have vaginal bleeding (lochia) after delivery. The amount and appearance of lochia is often similar to a menstrual period in the first week after delivery. It will gradually decrease over the next few weeks to a dry, yellow-brown discharge. For most women, lochia stops completely by 6-8 weeks after delivery. Vaginal bleeding can vary from woman to woman. °· Within the first few  days after delivery, you may have breast engorgement. This is when your breasts feel heavy, full, and uncomfortable. Your breasts may also throb and feel hard, tightly stretched, warm, and tender. After this occurs, you may have milk leaking from your breasts. Your health care provider can help you relieve discomfort due to breast engorgement. Breast engorgement should go away within a few days. °· You may feel more sad or worried than normal due to hormonal changes after delivery. These feelings should not last more than a few days. If these feelings do not go away after several days, speak with your health care provider. °How should I care for myself? °· Tell your health care provider if you have pain or discomfort. °· Drink enough water to keep your urine clear or pale yellow. °· Wash your hands thoroughly with soap and water for at least 20 seconds after changing your sanitary pads, after using the toilet, and before holding or feeding your baby. °· If you are not breastfeeding, avoid touching your breasts a lot. Doing this can make your breasts produce more milk. °· If you become weak or lightheaded, or you feel like you might faint, ask for help before: °¨ Getting out of bed. °¨ Showering. °· Change your sanitary pads frequently. Watch for any changes in your flow, such as a sudden increase in volume, a change in color, the passing of large blood clots. If you pass a blood clot from your vagina, save it   to show to your health care provider. Do not flush blood clots down the toilet without having your health care provider look at them. °· Make sure that all your vaccinations are up to date. This can help protect you and your baby from getting certain diseases. You may need to have immunizations done before you leave the hospital. °· If desired, talk with your health care provider about methods of family planning or birth control (contraception). °How can I start bonding with my baby? °Spending as much time as  possible with your baby is very important. During this time, you and your baby can get to know each other and develop a bond. Having your baby stay with you in your room (rooming in) can give you time to get to know your baby. Rooming in can also help you become comfortable caring for your baby. Breastfeeding can also help you bond with your baby. °How can I plan for returning home with my baby? °· Make sure that you have a car seat installed in your vehicle. °¨ Your car seat should be checked by a certified car seat installer to make sure that it is installed safely. °¨ Make sure that your baby fits into the car seat safely. °· Ask your health care provider any questions you have about caring for yourself or your baby. Make sure that you are able to contact your health care provider with any questions after leaving the hospital. °This information is not intended to replace advice given to you by your health care provider. Make sure you discuss any questions you have with your health care provider. °Document Released: 11/10/2006 Document Revised: 06/18/2015 Document Reviewed: 12/18/2014 °Elsevier Interactive Patient Education © 2017 [employer] ° ° ° °Pelvic Rest °Pelvic rest may be recommended if: °· Your placenta is partially or completely covering the opening of your cervix (placenta previa). °· There is bleeding between the wall of the uterus and the amniotic sac in the first trimester of pregnancy (subchorionic hemorrhage). °· You went into labor too early (preterm labor). °Based on your overall health and the health of your baby, your health care provider will decide if pelvic rest is right for you. °How do I rest my pelvis? °For as long as told by your health care provider: °· Do not have sex, sexual stimulation, or an orgasm. °· Do not use tampons. Do not douche. Do not put anything in your vagina. °· Do not lift anything that is heavier than 10 lb (4.5 kg). °· Avoid activities that take a lot of effort  (are strenuous). °· Avoid any activity in which your pelvic muscles could become strained. °When should I seek medical care? °Seek medical care if you have: °· Cramping pain in your lower abdomen. °· Vaginal discharge. °· A low, dull backache. °· Regular contractions. °· Uterine tightening. °When should I seek immediate medical care? °Seek immediate medical care if: °· You have vaginal bleeding and you are pregnant. °This information is not intended to replace advice given to you by your health care provider. Make sure you discuss any questions you have with your health care provider. °Document Released: 05/10/2010 Document Revised: 06/21/2015 Document Reviewed: 07/17/2014 °Elsevier Interactive Patient Education © 2017 [employer] ° °

## 2016-05-11 NOTE — Lactation Note (Signed)
This note was copied from a baby's chart. Lactation Consultation Note  Mother denies questions or concerns. Mom encouraged to feed baby 8-12 times/24 hours and with feeding cues.  Reviewed engorgement care and monitoring voids/stools. Suggest family call if lactation can be of further assistance.  Patient Name: [name] [id number] Date: 05/11/2016 Reason for consult: Follow-up assessment   Maternal Data    Feeding Feeding Type: Breast Fed Length of feed: 15 min  LATCH Score/Interventions                      Lactation Tools Discussed/Used     Consult Status Consult Status: Complete    Hardie Pulley 05/11/2016, 9:18 AM

## 2016-05-12 ENCOUNTER — Telehealth (HOSPITAL_COMMUNITY): Payer: Self-pay

## 2016-05-12 NOTE — Telephone Encounter (Signed)
Called patient to discuss BF questions. She did not answer but returned call immediately.  Mother's concern is that Cammy Brochure is eating for 15 minutes and  She is not sure he is feeding is long enough.  He softens the breast, sleeps 2-3 hours between feedings and is having appropriate output for DOL.  Re-assured her that everything on track. One recommendation was to offer both breasts at each feeding. Encouraged Mom Talk and BF support group tomorrow.

## 2016-06-26 ENCOUNTER — Ambulatory Visit (INDEPENDENT_AMBULATORY_CARE_PROVIDER_SITE_OTHER): Payer: Medicaid Other | Admitting: Family Medicine

## 2016-06-26 ENCOUNTER — Encounter: Payer: Self-pay | Admitting: Family Medicine

## 2016-06-26 MED ORDER — NORETHINDRONE 0.35 MG PO TABS: 1.0000 | ORAL_TABLET | Freq: Every day | ORAL | 11 refills | Status: DC

## 2016-06-26 NOTE — Progress Notes (Signed)
Post Partum Exam  [name] is a 21 y.o. 601P1001 female who presents for a postpartum visit. She is 7 weeks postpartum following a spontaneous vaginal delivery. I have fully reviewed the prenatal and intrapartum course. The delivery was at 41 gestational weeks.  Anesthesia: epidural. Postpartum course has been uneventful. Baby's course has been uneventful. Baby is feeding by breast. Bleeding no bleeding. Bowel function is normal. Bladder function is normal. Patient is not sexually active. Contraception method is progestrone only pill. Postpartum depression screening:neg (score:3)     Review of Systems Pertinent items are noted in HPI.    Objective:  Blood pressure 109/84, pulse 96, height 5\' 4"  (1.626 m), currently breastfeeding.  General:  alert, cooperative and no distress  Lungs: clear to auscultation bilaterally  Heart:  regular rate and rhythm, S1, S2 normal, no murmur, click, rub or gallop  Abdomen: soft, non-tender; bowel sounds normal; no masses,  no organomegaly        Assessment:    Normal postpartum exam. Pap smear not done at today's visit.   Plan:   1. Contraception: oral progesterone-only contraceptive 2. Follow up in: 1 year or as needed.

## 2016-07-29 ENCOUNTER — Encounter: Payer: Self-pay | Admitting: Family Medicine

## 2016-07-29 ENCOUNTER — Telehealth: Payer: Self-pay | Admitting: *Deleted

## 2016-07-29 DIAGNOSIS — B3731 Acute candidiasis of vulva and vagina: Secondary | ICD-10-CM

## 2016-07-29 DIAGNOSIS — B373 Candidiasis of vulva and vagina: Secondary | ICD-10-CM

## 2016-07-29 MED ORDER — FLUCONAZOLE 150 MG PO TABS: 150.0000 mg | ORAL_TABLET | Freq: Once | ORAL | 0 refills | Status: AC

## 2016-07-29 NOTE — Telephone Encounter (Signed)
Pt called stating that she is having vaginal itching with white thick discharge.  She requested a RX for Diflucan.  This was sent to her pharmacy

## 2016-08-02 ENCOUNTER — Encounter (HOSPITAL_BASED_OUTPATIENT_CLINIC_OR_DEPARTMENT_OTHER): Payer: Self-pay | Admitting: Emergency Medicine

## 2016-08-02 ENCOUNTER — Emergency Department (HOSPITAL_BASED_OUTPATIENT_CLINIC_OR_DEPARTMENT_OTHER)
Admission: EM | Admit: 2016-08-02 | Discharge: 2016-08-02 | Disposition: A | Discharge status: Home / Self Care | Payer: Medicaid Other | Attending: Emergency Medicine | Admitting: Emergency Medicine

## 2016-08-02 DIAGNOSIS — Z79899 Other long term (current) drug therapy: Secondary | ICD-10-CM | POA: Diagnosis not present

## 2016-08-02 DIAGNOSIS — L02416 Cutaneous abscess of left lower limb: Secondary | ICD-10-CM | POA: Diagnosis present

## 2016-08-02 MED ORDER — LIDOCAINE-EPINEPHRINE (PF) 2 %-1:200000 IJ SOLN
10.0000 mL | Freq: Once | INTRAMUSCULAR | Status: AC
  Administered 2016-08-02: 10 mL
  Filled 2016-08-02: qty 10

## 2016-08-02 MED ORDER — PENTAFLUOROPROP-TETRAFLUOROETH EX AERO
INHALATION_SPRAY | CUTANEOUS | Status: AC
  Administered 2016-08-02: 1 via TOPICAL
  Filled 2016-08-02: qty 30

## 2016-08-02 MED ORDER — PENTAFLUOROPROP-TETRAFLUOROETH EX AERO
INHALATION_SPRAY | Freq: Once | CUTANEOUS | Status: AC
  Administered 2016-08-02: 1 via TOPICAL

## 2016-08-02 MED ORDER — SULFAMETHOXAZOLE-TRIMETHOPRIM 800-160 MG PO TABS
1.0000 | ORAL_TABLET | Freq: Two times a day (BID) | ORAL | 0 refills | Status: AC
Start: 2016-08-02 — End: 2016-08-09

## 2016-08-02 MED ORDER — SULFAMETHOXAZOLE-TRIMETHOPRIM 800-160 MG PO TABS
1.0000 | ORAL_TABLET | Freq: Once | ORAL | Status: AC
  Administered 2016-08-02: 1 via ORAL
  Filled 2016-08-02: qty 1

## 2016-08-02 NOTE — ED Provider Notes (Signed)
[facility] Provider Note   CSN: [id number] Arrival date & time: 08/02/16  1858   By signing my name below, I, Clarisse GougeXavier Herndon, attest that this documentation has been prepared under the direction and in the presence of Josh Tobin Witucki PA-C. Electronically signed, Clarisse GougeXavier Herndon, ED Scribe. 08/02/16. 7:20 PM.  History   Chief Complaint Chief Complaint  Patient presents with  . Abscess   The history is provided by the patient and medical records. No language interpreter was used.    [name] is a 21 y.o. female with a reported h/o abscesses presenting to the Emergency Department concerning raised, painful bump to the L inner thigh onset 2-3 days ago. 5/10, constant aching pain described in triage; pt states this is worse with contact and palpation. H/o similar to the same area with I&D procedures noted for treatment in the past. Pt states she has applied hot compresses to the affected area without relief. No N/V, fever, headache or any other complaints noted at this time.   Past Medical History:  Diagnosis Date  . Eczema     There are no active problems to display for this patient.   Past Surgical History:  Procedure Laterality Date  . ovary removed      OB History    Gravida Para Term Preterm AB Living   1 1 1     1    SAB TAB Ectopic Multiple Live Births         0 1       Home Medications    Prior to Admission medications   Medication Sig Start Date End Date Taking? Authorizing Provider  acetaminophen (TYLENOL) 500 MG tablet Take 500 mg by mouth every 6 (six) hours as needed.    [provider]  calcium carbonate (TUMS - DOSED IN MG ELEMENTAL CALCIUM) 500 MG chewable tablet Chew 1 tablet by mouth daily.    [provider]  norethindrone (ORTHO MICRONOR) 0.35 MG tablet Take 1 tablet (0.35 mg total) by mouth daily. 06/26/16   Levie HeritageStinson, Jacob J, DO    Family History Family History  Problem Relation Age of Onset  . Aneurysm Mother   .  Fibroids Mother     Social History Social History  Substance Use Topics  . Smoking status: Never Smoker  . Smokeless tobacco: Never Used  . Alcohol use No     Allergies   Patient has no known allergies.   Review of Systems Review of Systems  Constitutional: Negative for fever.  Gastrointestinal: Negative for abdominal pain, nausea and vomiting.  Skin: Positive for color change. Negative for wound.       Positive raised bump to inner L thigh  Neurological: Negative for headaches.     Physical Exam Updated Vital Signs BP 116/78 (BP Location: Left Arm)   Pulse (!) 101   Temp 99.3 F (37.4 C) (Oral)   Resp 18   Ht 5\' 4"  (1.626 m)   Wt 153 lb (69.4 kg)   LMP 07/19/2016   SpO2 100%   BMI 26.26 kg/m   Physical Exam  Constitutional: She appears well-developed and well-nourished.  HENT:  Head: Normocephalic and atraumatic.  Eyes: Conjunctivae are normal. Right eye exhibits no discharge. Left eye exhibits no discharge.  Neck: Normal range of motion. Neck supple.  Cardiovascular: Normal rate and regular rhythm.   Pulmonary/Chest: Effort normal.  Abdominal: Soft. There is no tenderness.  Neurological: She is alert.  Skin: Skin is warm and dry. There  is erythema.  4 cm area of induration and fluctuance to the L inner thigh consistent with abscess. Extensive surrounding cellulitis.  Psychiatric: She has a normal mood and affect.  Nursing note and vitals reviewed.    ED Treatments / Results  DIAGNOSTIC STUDIES: Oxygen Saturation is 100% on RA, NL by my interpretation.    COORDINATION OF CARE: 7:15 PM-Discussed next steps with pt. Pt verbalized understanding and is agreeable with the plan. Pt prepared for I&D. Will order and Rx abx.   Labs (all labs ordered are listed, but only abnormal results are displayed) Labs Reviewed - No data to display  EKG  EKG Interpretation None       Radiology No results found.  Procedures Procedures (including critical care  time)  Medications Ordered in ED Medications  lidocaine-EPINEPHrine (XYLOCAINE W/EPI) 2 %-1:200000 (PF) injection 10 mL (not administered)  pentafluoroprop-tetrafluoroeth (GEBAUERS) aerosol (not administered)     Initial Impression / Assessment and Plan / ED Course  I have reviewed the triage vital signs and the nursing notes.  Pertinent labs & imaging results that were available during my care of the patient were reviewed by me and considered in my medical decision making (see chart for details).     INCISION AND DRAINAGE Performed by: Carolee Rota Consent: Verbal consent obtained. Risks and benefits: risks, benefits and alternatives were discussed Type: abscess  Body area: L thigh  Anesthesia: local infiltration  Incision was made with a scalpel.  Local anesthetic: lidocaine 2% with epinephrine  Anesthetic total: 2 ml  Complexity: complex Blunt dissection to break up loculations  Drainage: purulent  Drainage amount: large  Packing material: none  Patient tolerance: Patient tolerated the procedure well with no immediate complications.  The patient was urged to return to the Emergency Department urgently with worsening pain, swelling, expanding erythema especially if it streaks away from the affected area, fever, or if they have any other concerns.   The patient was urged to return to the Emergency Department or go to their PCP in 48 hours for wound recheck if the area is not significantly improved.  The patient verbalized understanding and stated agreement with this plan.    Final Clinical Impressions(s) / ED Diagnoses   Final diagnoses:  Abscess of left thigh   Patient with skin abscess amenable to incision and drainage. Bactrim is described as there is associated cellulitis. No systemic symptoms of illness.   New Prescriptions New Prescriptions   SULFAMETHOXAZOLE-TRIMETHOPRIM (BACTRIM DS,SEPTRA DS) 800-160 MG TABLET    Take 1 tablet by mouth 2 (two)  times daily.  I personally performed the services described in this documentation, which was scribed in my presence. The recorded information has been reviewed and is accurate.     Renne Crigler, PA-C 08/02/16 1940    Pricilla Loveless, MD 08/03/16 2300

## 2016-08-02 NOTE — ED Triage Notes (Signed)
Patient states that she has a boil to her inner thigh

## 2016-08-02 NOTE — ED Notes (Signed)
ED Provider at bedside. 

## 2016-08-02 NOTE — Discharge Instructions (Signed)
Please read and follow all provided instructions.  Your diagnoses today include:  1. Abscess of left thigh     Tests performed today include:  Vital signs. See below for your results today.   Medications prescribed:   Bactrim (trimethoprim/sulfamethoxazole) - antibiotic  You have been prescribed an antibiotic medicine: take the entire course of medicine even if you are feeling better. Stopping early can cause the antibiotic not to work.  Take any prescribed medications only as directed.   Home care instructions:   Follow any educational materials contained in this packet  Follow-up instructions: Return to the Emergency Department in 48 hours for a recheck if your symptoms are not significantly improved.  Please follow-up with your primary care provider in the next 1 week for further evaluation of your symptoms.   Return instructions:  Return to the Emergency Department if you have:  Fever  Worsening symptoms  Worsening pain  Worsening swelling  Redness of the skin that moves away from the affected area, especially if it streaks away from the affected area   Any other emergent concerns  Your vital signs today were: BP 116/78 (BP Location: Left Arm)    Pulse (!) 101    Temp 99.3 F (37.4 C) (Oral)    Resp 18    Ht 5\' 4"  (1.626 m)    Wt 69.4 kg (153 lb)    LMP 07/19/2016    SpO2 100%    BMI 26.26 kg/m  If your blood pressure (BP) was elevated above 135/85 this visit, please have this repeated by your doctor within one month. --------------

## 2016-12-01 ENCOUNTER — Encounter: Payer: Self-pay | Admitting: Certified Nurse Midwife

## 2016-12-02 ENCOUNTER — Other Ambulatory Visit: Payer: Self-pay | Admitting: *Deleted

## 2016-12-02 MED ORDER — LEVONORGESTREL 1.5 MG PO TABS: 1.5000 mg | ORAL_TABLET | Freq: Once | ORAL | 0 refills | Status: AC

## 2016-12-02 NOTE — Progress Notes (Signed)
[facility]. Pt request after intercourse.

## 2017-03-18 ENCOUNTER — Encounter: Payer: Self-pay | Admitting: Obstetrics & Gynecology

## 2017-03-30 ENCOUNTER — Other Ambulatory Visit (INDEPENDENT_AMBULATORY_CARE_PROVIDER_SITE_OTHER): Payer: Self-pay | Admitting: *Deleted

## 2017-03-30 VITALS — BP 112/75 | HR 92 | Wt 140.0 lb

## 2017-03-30 DIAGNOSIS — R399 Unspecified symptoms and signs involving the genitourinary system: Secondary | ICD-10-CM

## 2017-03-30 LAB — POCT URINALYSIS DIPSTICK
BILIRUBIN UA: NEGATIVE
Glucose, UA: NEGATIVE
Ketones, UA: NEGATIVE
Nitrite, UA: NEGATIVE
SPEC GRAV UA: 1.02 (ref 1.010–1.025)
Urobilinogen, UA: 0.2 E.U./dL
pH, UA: 6 (ref 5.0–8.0)

## 2017-03-30 NOTE — Progress Notes (Signed)
SUBJECTIVE: [name] is a 22 y.o. female who complains of urinary frequency, urgency and dysuria, without flank pain, fever, chills, or abnormal vaginal discharge or bleeding.   OBJECTIVE: Appears well, in no apparent distress.  Vital signs are normal. Urine dipstick shows positive for WBC's, positive for RBC's and positive for protein.    ASSESSMENT: Dysuria  PLAN: Treatment per orders.  UC sent today, await results for treatment. Call or return to clinic prn if these symptoms worsen or fail to improve as anticipated.

## 2017-03-30 NOTE — Progress Notes (Signed)
Chart reviewed - agree with MA documentation.   

## 2017-04-01 ENCOUNTER — Encounter: Payer: Self-pay | Admitting: Family Medicine

## 2017-04-01 ENCOUNTER — Other Ambulatory Visit: Payer: Self-pay | Admitting: Family Medicine

## 2017-04-01 ENCOUNTER — Other Ambulatory Visit: Payer: Self-pay | Admitting: *Deleted

## 2017-04-01 DIAGNOSIS — N3000 Acute cystitis without hematuria: Secondary | ICD-10-CM

## 2017-04-01 LAB — SPECIMEN STATUS REPORT

## 2017-04-01 LAB — URINE CULTURE

## 2017-04-01 MED ORDER — SULFAMETHOXAZOLE-TRIMETHOPRIM 800-160 MG PO TABS: 1.0000 | ORAL_TABLET | Freq: Two times a day (BID) | ORAL | 0 refills | Status: DC

## 2017-04-01 MED ORDER — FLUCONAZOLE 150 MG PO TABS: 150.0000 mg | ORAL_TABLET | Freq: Once | ORAL | 0 refills | Status: AC

## 2017-04-01 MED ORDER — FLUCONAZOLE 150 MG PO TABS: 150.0000 mg | ORAL_TABLET | Freq: Once | ORAL | 0 refills | Status: DC

## 2017-04-01 NOTE — Progress Notes (Signed)
Septra DS sent to pt pharmacy, per protocol, for +UTI. Pt also request Diflucan as she gets yeast inf after antibiotic use.

## 2017-04-23 ENCOUNTER — Encounter: Payer: Self-pay | Admitting: Family Medicine

## 2017-04-23 DIAGNOSIS — N3000 Acute cystitis without hematuria: Secondary | ICD-10-CM

## 2017-04-24 MED ORDER — SULFAMETHOXAZOLE-TRIMETHOPRIM 800-160 MG PO TABS: 1.0000 | ORAL_TABLET | Freq: Two times a day (BID) | ORAL | 0 refills | Status: DC

## 2017-05-08 ENCOUNTER — Other Ambulatory Visit: Payer: Self-pay

## 2017-05-08 ENCOUNTER — Encounter: Payer: Self-pay | Admitting: Family Medicine

## 2017-05-08 MED ORDER — FLUCONAZOLE 150 MG PO TABS: 150.0000 mg | ORAL_TABLET | Freq: Once | ORAL | 1 refills | Status: AC

## 2017-07-22 ENCOUNTER — Ambulatory Visit (INDEPENDENT_AMBULATORY_CARE_PROVIDER_SITE_OTHER): Payer: Medicaid Other

## 2017-07-22 VITALS — BP 103/71 | HR 91 | Wt 145.0 lb

## 2017-07-22 DIAGNOSIS — R350 Frequency of micturition: Secondary | ICD-10-CM | POA: Diagnosis not present

## 2017-07-22 DIAGNOSIS — R300 Dysuria: Secondary | ICD-10-CM | POA: Diagnosis not present

## 2017-07-22 LAB — POCT URINALYSIS DIPSTICK
Glucose, UA: NEGATIVE
Leukocytes, UA: NEGATIVE
Nitrite, UA: NEGATIVE
Protein, UA: POSITIVE — AB
UROBILINOGEN UA: 2 U/dL — AB

## 2017-07-22 MED ORDER — SULFAMETHOXAZOLE-TRIMETHOPRIM 800-160 MG PO TABS: 1.0000 | ORAL_TABLET | Freq: Two times a day (BID) | ORAL | 0 refills | Status: DC

## 2017-07-22 MED ORDER — FLUCONAZOLE 150 MG PO TABS: 150.0000 mg | ORAL_TABLET | Freq: Once | ORAL | 0 refills | Status: AC

## 2017-07-22 MED ORDER — PHENAZOPYRIDINE HCL 200 MG PO TABS: 200.0000 mg | ORAL_TABLET | Freq: Three times a day (TID) | ORAL | 0 refills | Status: DC | PRN

## 2017-07-22 NOTE — Progress Notes (Signed)
Patient complaining of urinary frequency and painful urination for three days. Armandina StammerJennifer Howard RN

## 2017-07-22 NOTE — Progress Notes (Signed)
Patient seen and assessed by nursing staff.  Agree with documentation and plan.  

## 2017-07-24 ENCOUNTER — Telehealth: Payer: Self-pay

## 2017-07-24 LAB — URINE CULTURE

## 2017-07-24 NOTE — Telephone Encounter (Signed)
-----   Message from Reva Boresanya S Pratt, MD sent at 07/24/2017  9:13 AM EDT ----- Her urine culture is negative.

## 2017-07-24 NOTE — Telephone Encounter (Signed)
Left message for patient to return call to office.  Urine culture negative. If she is still have symptoms may need to come in for swab for BV, yeast, and if she has any new partners STI test. Erin StammerJennifer Howard

## 2017-09-17 ENCOUNTER — Emergency Department (HOSPITAL_BASED_OUTPATIENT_CLINIC_OR_DEPARTMENT_OTHER): Payer: Medicaid Other

## 2017-09-17 ENCOUNTER — Other Ambulatory Visit: Payer: Self-pay

## 2017-09-17 ENCOUNTER — Encounter (HOSPITAL_BASED_OUTPATIENT_CLINIC_OR_DEPARTMENT_OTHER): Payer: Self-pay | Admitting: Emergency Medicine

## 2017-09-17 ENCOUNTER — Emergency Department (HOSPITAL_BASED_OUTPATIENT_CLINIC_OR_DEPARTMENT_OTHER)
Admission: EM | Admit: 2017-09-17 | Discharge: 2017-09-17 | Disposition: A | Discharge status: Home / Self Care | Payer: Medicaid Other | Attending: Emergency Medicine | Admitting: Emergency Medicine

## 2017-09-17 DIAGNOSIS — O034 Incomplete spontaneous abortion without complication: Secondary | ICD-10-CM | POA: Diagnosis not present

## 2017-09-17 DIAGNOSIS — R1030 Lower abdominal pain, unspecified: Secondary | ICD-10-CM | POA: Diagnosis present

## 2017-09-17 DIAGNOSIS — Z3A00 Weeks of gestation of pregnancy not specified: Secondary | ICD-10-CM | POA: Diagnosis not present

## 2017-09-17 LAB — COMPREHENSIVE METABOLIC PANEL
ALBUMIN: 3.9 g/dL (ref 3.5–5.0)
ALT: 11 U/L (ref 0–44)
AST: 19 U/L (ref 15–41)
Alkaline Phosphatase: 73 U/L (ref 38–126)
Anion gap: 12 (ref 5–15)
BILIRUBIN TOTAL: 0.4 mg/dL (ref 0.3–1.2)
BUN: 7 mg/dL (ref 6–20)
CHLORIDE: 107 mmol/L (ref 98–111)
CO2: 22 mmol/L (ref 22–32)
CREATININE: 0.51 mg/dL (ref 0.44–1.00)
Calcium: 8.4 mg/dL — ABNORMAL LOW (ref 8.9–10.3)
GFR calc Af Amer: >60 mL/min (ref >60)
GFR calc non Af Amer: >60 mL/min (ref >60)
GLUCOSE: 85 mg/dL (ref 70–99)
POTASSIUM: 2.8 mmol/L — AB (ref 3.5–5.1)
Sodium: 141 mmol/L (ref 135–145)
Total Protein: 6.9 g/dL (ref 6.5–8.1)

## 2017-09-17 LAB — URINALYSIS, ROUTINE W REFLEX MICROSCOPIC

## 2017-09-17 LAB — CBC WITH DIFFERENTIAL/PLATELET
Basophils Absolute: 0 10*3/uL (ref 0.0–0.1)
Basophils Relative: 0 %
EOS PCT: 0 %
Eosinophils Absolute: 0 10*3/uL (ref 0.0–0.7)
HCT: 27.2 % — ABNORMAL LOW (ref 36.0–46.0)
Hemoglobin: 8.4 g/dL — ABNORMAL LOW (ref 12.0–15.0)
LYMPHS ABS: 1.5 10*3/uL (ref 0.7–4.0)
LYMPHS PCT: 15 %
MCH: 25.2 pg — AB (ref 26.0–34.0)
MCHC: 30.9 g/dL (ref 30.0–36.0)
MCV: 81.7 fL (ref 78.0–100.0)
MONO ABS: 0.6 10*3/uL (ref 0.1–1.0)
MONOS PCT: 6 %
Neutro Abs: 7.7 10*3/uL (ref 1.7–7.7)
Neutrophils Relative %: 79 %
PLATELETS: 207 10*3/uL (ref 150–400)
RBC: 3.33 MIL/uL — ABNORMAL LOW (ref 3.87–5.11)
RDW: 15.7 % — AB (ref 11.5–15.5)
WBC: 9.9 10*3/uL (ref 4.0–10.5)

## 2017-09-17 LAB — WET PREP, GENITAL
Sperm: NONE SEEN
Trich, Wet Prep: NONE SEEN
Yeast Wet Prep HPF POC: NONE SEEN

## 2017-09-17 LAB — URINALYSIS, MICROSCOPIC (REFLEX): RBC / HPF: >50 RBC/hpf (ref 0–5)

## 2017-09-17 LAB — HCG, QUANTITATIVE, PREGNANCY: hCG, Beta Chain, Quant, S: 7779 m[IU]/mL — ABNORMAL HIGH (ref <5)

## 2017-09-17 MED ORDER — POTASSIUM CHLORIDE ER 10 MEQ PO TBCR: 10.0000 meq | EXTENDED_RELEASE_TABLET | Freq: Every day | ORAL | 0 refills | Status: DC

## 2017-09-17 MED ORDER — POTASSIUM CHLORIDE CRYS ER 20 MEQ PO TBCR
60.0000 meq | EXTENDED_RELEASE_TABLET | Freq: Once | ORAL | Status: AC
  Administered 2017-09-17: 60 meq via ORAL
  Filled 2017-09-17: qty 3

## 2017-09-17 MED ORDER — MORPHINE SULFATE (PF) 4 MG/ML IV SOLN
4.0000 mg | Freq: Once | INTRAVENOUS | Status: AC
  Administered 2017-09-17: 4 mg via INTRAVENOUS
  Filled 2017-09-17: qty 1

## 2017-09-17 MED ORDER — POTASSIUM CHLORIDE 10 MEQ/100ML IV SOLN
10.0000 meq | INTRAVENOUS | Status: AC
  Administered 2017-09-17 (×2): 10 meq via INTRAVENOUS
  Filled 2017-09-17 (×2): qty 100

## 2017-09-17 MED ORDER — SODIUM CHLORIDE 0.9 % IV BOLUS
1000.0000 mL | Freq: Once | INTRAVENOUS | Status: AC
  Administered 2017-09-17: 1000 mL via INTRAVENOUS

## 2017-09-17 MED ORDER — DICYCLOMINE HCL 10 MG PO CAPS
10.0000 mg | ORAL_CAPSULE | Freq: Once | ORAL | Status: AC
  Administered 2017-09-17: 10 mg via ORAL
  Filled 2017-09-17: qty 1

## 2017-09-17 NOTE — ED Notes (Signed)
Attempted IV stick x 2 to right and left hand. unsuccessful

## 2017-09-17 NOTE — ED Provider Notes (Signed)
[facility] EMERGENCY DEPARTMENT Provider Note   CSN: [id number] Arrival date & time: 09/17/17  1324     History   Chief Complaint Chief Complaint  Patient presents with  . Abdominal Pain    HPI [name] is a 22 y.o. female with history of eczema who presents with lower abdominal cramping and vaginal bleeding after medical abortion last week.  Patient reports she was given 1 pill on 09/12/2017 and 4 more pills on 09/13/2017.  She reports she is having dizziness and lightheadedness and intermittent abdominal cramping.  She reports she has had decreased vaginal bleeding today.  She has been taking 800 mg ibuprofen with some relief.  She denies any fevers, other abnormal vaginal discharge, urinary symptoms.  Although, patient states her cramping gets worse after urination.  She reports history of having only her left ovary, which is where all of her cramping is.  HPI  Past Medical History:  Diagnosis Date  . Eczema     There are no active problems to display for this patient.   Past Surgical History:  Procedure Laterality Date  . ovary removed       OB History    Gravida  1   Para  1   Term  1   Preterm      AB      Living  1     SAB      TAB      Ectopic      Multiple  0   Live Births  1            Home Medications    Prior to Admission medications   Medication Sig Start Date End Date Taking? Authorizing Provider  acetaminophen (TYLENOL) 500 MG tablet Take 500 mg by mouth every 6 (six) hours as needed.    [provider]  calcium carbonate (TUMS - DOSED IN MG ELEMENTAL CALCIUM) 500 MG chewable tablet Chew 1 tablet by mouth daily.    [provider]  norethindrone (ORTHO MICRONOR) 0.35 MG tablet Take 1 tablet (0.35 mg total) by mouth daily. 06/26/16   Levie HeritageStinson, Jacob J, DO  phenazopyridine (PYRIDIUM) 200 MG tablet Take 1 tablet (200 mg total) by mouth 3 (three) times daily as needed for pain. 07/22/17   Reva BoresPratt, Tanya S, MD    potassium chloride (K-DUR) 10 MEQ tablet Take 1 tablet (10 mEq total) by mouth daily. 09/17/17   Taneah Masri, Waylan BogaAlexandra M, PA-C  sulfamethoxazole-trimethoprim (BACTRIM DS,SEPTRA DS) 800-160 MG tablet Take 1 tablet by mouth 2 (two) times daily. 07/22/17   Reva BoresPratt, Tanya S, MD    Family History Family History  Problem Relation Age of Onset  . Aneurysm Mother   . Fibroids Mother     Social History Social History   Tobacco Use  . Smoking status: Never Smoker  . Smokeless tobacco: Never Used  Substance Use Topics  . Alcohol use: No  . Drug use: No     Allergies   Patient has no known allergies.   Review of Systems Review of Systems  Constitutional: Negative for chills and fever.  HENT: Negative for facial swelling and sore throat.   Respiratory: Negative for shortness of breath.   Cardiovascular: Negative for chest pain.  Gastrointestinal: Positive for abdominal pain. Negative for nausea and vomiting.  Genitourinary: Positive for vaginal bleeding. Negative for dysuria.  Musculoskeletal: Negative for back pain.  Skin: Negative for rash and wound.  Neurological: Positive for dizziness and light-headedness. Negative  for headaches.  Psychiatric/Behavioral: The patient is not nervous/anxious.      Physical Exam Updated Vital Signs BP 121/72   Pulse 81   Temp 98.1 F (36.7 C) (Oral)   Resp 16   Ht 5\' 4"  (1.626 m)   Wt 67.1 kg   LMP 07/15/2017 (Approximate)   SpO2 100%   BMI 25.40 kg/m   Physical Exam  Constitutional: She appears well-developed and well-nourished. No distress.  HENT:  Head: Normocephalic and atraumatic.  Mouth/Throat: Oropharynx is clear and moist. No oropharyngeal exudate.  Eyes: Pupils are equal, round, and reactive to light. Conjunctivae are normal. Right eye exhibits no discharge. Left eye exhibits no discharge. No scleral icterus.  Neck: Normal range of motion. Neck supple. No thyromegaly present.  Cardiovascular: Normal rate, regular rhythm, normal  heart sounds and intact distal pulses. Exam reveals no gallop and no friction rub.  No murmur heard. Pulmonary/Chest: Effort normal and breath sounds normal. No stridor. No respiratory distress. She has no wheezes. She has no rales.  Abdominal: Soft. Bowel sounds are normal. She exhibits no distension. There is no tenderness. There is no rebound and no guarding.  Musculoskeletal: She exhibits no edema.  Lymphadenopathy:    She has no cervical adenopathy.  Neurological: She is alert. Coordination normal.  Skin: Skin is warm and dry. No rash noted. She is not diaphoretic. No pallor.  Psychiatric: She has a normal mood and affect.  Nursing note and vitals reviewed.    ED Treatments / Results  Labs (all labs ordered are listed, but only abnormal results are displayed) Labs Reviewed  WET PREP, GENITAL - Abnormal; Notable for the following components:      Result Value   Clue Cells Wet Prep HPF POC PRESENT (*)    WBC, Wet Prep HPF POC MANY (*)    All other components within normal limits  URINALYSIS, ROUTINE W REFLEX MICROSCOPIC - Abnormal; Notable for the following components:   Color, Urine   (*)    Value: TEST NOT REPORTED DUE TO COLOR INTERFERENCE OF URINE PIGMENT   APPearance   (*)    Value: TEST NOT REPORTED DUE TO COLOR INTERFERENCE OF URINE PIGMENT   Glucose, UA   (*)    Value: TEST NOT REPORTED DUE TO COLOR INTERFERENCE OF URINE PIGMENT   Hgb urine dipstick   (*)    Value: TEST NOT REPORTED DUE TO COLOR INTERFERENCE OF URINE PIGMENT   Bilirubin Urine   (*)    Value: TEST NOT REPORTED DUE TO COLOR INTERFERENCE OF URINE PIGMENT   Ketones, ur   (*)    Value: TEST NOT REPORTED DUE TO COLOR INTERFERENCE OF URINE PIGMENT   Protein, ur   (*)    Value: TEST NOT REPORTED DUE TO COLOR INTERFERENCE OF URINE PIGMENT   Nitrite   (*)    Value: TEST NOT REPORTED DUE TO COLOR INTERFERENCE OF URINE PIGMENT   Leukocytes, UA   (*)    Value: TEST NOT REPORTED DUE TO COLOR INTERFERENCE OF  URINE PIGMENT   All other components within normal limits  URINALYSIS, MICROSCOPIC (REFLEX) - Abnormal; Notable for the following components:   Bacteria, UA FEW (*)    All other components within normal limits  COMPREHENSIVE METABOLIC PANEL - Abnormal; Notable for the following components:   Potassium 2.8 (*)    Calcium 8.4 (*)    All other components within normal limits  CBC WITH DIFFERENTIAL/PLATELET - Abnormal; Notable for the following components:  RBC 3.33 (*)    Hemoglobin 8.4 (*)    HCT 27.2 (*)    MCH 25.2 (*)    RDW 15.7 (*)    All other components within normal limits  HCG, QUANTITATIVE, PREGNANCY - Abnormal; Notable for the following components:   hCG, Beta Chain, Quant, S 7,779 (*)    All other components within normal limits  URINE CULTURE  GC/CHLAMYDIA PROBE AMP ([place]) NOT AT [facility]    EKG None  Radiology US Ob Comp < 14 Wks  Result Date: 09/17/2017 CLINICAL DATA:  22 y/o F; severe cramping and bleeding. Medical abortion on Saturday and Sunday. History of right oophorectomy. EXAM: OBSTETRIC <14 WK [place] AND TRANSVAGINAL OB US TECHNIQUE: Both transabdominal and transvaginal ultrasound examinations were performed for complete evaluation of the gestation as well as the maternal uterus, adnexal regions, and pelvic cul-de-sac. Transvaginal technique was performed to assess early pregnancy. COMPARISON:  None. FINDINGS: Intrauterine gestational sac: None Yolk sac:  Not Visualized. Embryo:  Not Visualized. Cardiac Activity: Not Visualized. Subchorionic hemorrhage:  None visualized. Maternal uterus/adnexae: Right ovary is surgically absent. Thickened 20 mm endometrium with heterogeneous contents compatible with recent abortion. Increased blood flow within the endometrium may represent retained products of conception. Oblong nodular structure in the left adnexa along the course of the iliac vessels adjacent to the left ovary measuring 2.0 x 0.8 x 1.1 cm, likely lymph node.  IMPRESSION: 1. No intrauterine or extrauterine pregnancy identified. 2. Thickened endometrium with heterogeneous contents. Increased blood flow within the endometrium may represent retained products of conception. 3. Incidental note of a prominent lymph node in the left pelvis. Electronically Signed   By: Mitzi Hansen M.D.   On: 09/17/2017 16:23   US Ob Transvaginal  Result Date: 09/17/2017 CLINICAL DATA:  23 y/o F; severe cramping and bleeding. Medical abortion on Saturday and Sunday. History of right oophorectomy. EXAM: OBSTETRIC <14 WK [place] AND TRANSVAGINAL OB US TECHNIQUE: Both transabdominal and transvaginal ultrasound examinations were performed for complete evaluation of the gestation as well as the maternal uterus, adnexal regions, and pelvic cul-de-sac. Transvaginal technique was performed to assess early pregnancy. COMPARISON:  None. FINDINGS: Intrauterine gestational sac: None Yolk sac:  Not Visualized. Embryo:  Not Visualized. Cardiac Activity: Not Visualized. Subchorionic hemorrhage:  None visualized. Maternal uterus/adnexae: Right ovary is surgically absent. Thickened 20 mm endometrium with heterogeneous contents compatible with recent abortion. Increased blood flow within the endometrium may represent retained products of conception. Oblong nodular structure in the left adnexa along the course of the iliac vessels adjacent to the left ovary measuring 2.0 x 0.8 x 1.1 cm, likely lymph node. IMPRESSION: 1. No intrauterine or extrauterine pregnancy identified. 2. Thickened endometrium with heterogeneous contents. Increased blood flow within the endometrium may represent retained products of conception. 3. Incidental note of a prominent lymph node in the left pelvis. Electronically Signed   By: Mitzi Hansen M.D.   On: 09/17/2017 16:23    Procedures Procedures (including critical care time)  Medications Ordered in ED Medications  sodium chloride 0.9 % bolus 1,000 mL (0 mLs  Intravenous Stopped 09/17/17 1902)  morphine 4 MG/ML injection 4 mg (4 mg Intravenous Given 09/17/17 1535)  potassium chloride 10 mEq in 100 mL IVPB (0 mEq Intravenous Stopped 09/17/17 1902)  potassium chloride SA (K-DUR,KLOR-CON) CR tablet 60 mEq (60 mEq Oral Given 09/17/17 1508)  dicyclomine (BENTYL) capsule 10 mg (10 mg Oral Given 09/17/17 1707)     Initial Impression / Assessment and Plan /  ED Course  I have reviewed the triage vital signs and the nursing notes.  Pertinent labs & imaging results that were available during my care of the patient were reviewed by me and considered in my medical decision making (see chart for details).     Patient presenting with incomplete right medical abortion.  Hemoglobin 8.4.  Patient feeling much better after IV fluids in the ED.  Potassium 2.8 which was replaced in the ED and will discharge home with 5 more days of potassium.  Wet prep shows clue cells, many WBCs.  GC/chlamydia sent and pending. HCG 7,779. UA shows too much blood to read to test. Urine culture sent.  Pelvic ultrasound showed signs of retained products of conception.  I discussed patient case with OB/GYN, Dr. Alysia Penna, who guided the patient [facility] with [facility], but this was typical symptoms after medical abortion.  Continue pain control with ibuprofen and strict return precautions given.  Patient understands and agrees with plan.  Patient vitals stable throughout ED course and discharged in satisfactory condition.  Final Clinical Impressions(s) / ED Diagnoses   Final diagnoses:  Incomplete abortion without complications    ED Discharge Orders         Ordered    potassium chloride (K-DUR) 10 MEQ tablet  Daily     09/17/17 1849           Emi Holes, PA-C 09/17/17 2337    Melene Plan, DO 09/17/17 2342

## 2017-09-17 NOTE — ED Notes (Signed)
Patient transported to Ultrasound 

## 2017-09-17 NOTE — Discharge Instructions (Signed)
Take potassium as prescribed.  Take ibuprofen 800 mg every 8 hours.  You can alternate with Tylenol as prescribed over-the-counter.  [facility] to make them aware of your symptoms and follow-up with him as indicated.  [facility] if you develop any worsening symptoms including increasing vaginal bleeding more than 1 pad per hour, severe worsening pain, passing out, or any other concerning symptom.

## 2017-09-17 NOTE — ED Notes (Signed)
Pt took 800mg  ibuprofen at 1200

## 2017-09-17 NOTE — ED Triage Notes (Signed)
[facility] pill and took 4 more abortion pills on Sunday as directed.  States she is having worsening abdominal pain but has passed some fragments.

## 2017-09-18 LAB — GC/CHLAMYDIA PROBE AMP (~~LOC~~) NOT AT ARMC
Chlamydia: NEGATIVE
Neisseria Gonorrhea: NEGATIVE

## 2017-09-18 LAB — URINE CULTURE: Culture: >=100000 — AB

## 2017-09-19 ENCOUNTER — Telehealth: Payer: Self-pay

## 2017-09-19 NOTE — Telephone Encounter (Signed)
No treatment for UC ED 09/17/17 per Lennox LaityMichael Maczi PA

## 2017-09-19 NOTE — Progress Notes (Signed)
ED Antimicrobial Stewardship Positive Culture Follow Up   [name] is an 22 y.o. female who [facility]2017 with a chief complaint of abdominal pain and vaginal bleeding. PMH s/f medical abortion 1 week ago.  Recent Results (from the past 720 hour(s))  Urine culture     Status: Abnormal   Collection Time: 09/17/17  1:39 PM  Result Value Ref Range Status   Specimen Description   Final    URINE, RANDOM Performed at [facility] [place], [address] [place], [place] [address]    Special Requests   Final    NONE Performed at [facility], [address] [place], [place] [address]    Culture >=100,000 COLONIES/mL VIRIDANS STREPTOCOCCUS (A)  Final   Report Status 09/18/2017 FINAL  Final  Wet prep, genital     Status: Abnormal   Collection Time: 09/17/17  5:04 PM  Result Value Ref Range Status   Yeast Wet Prep HPF POC NONE SEEN NONE SEEN Final   Trich, Wet Prep NONE SEEN NONE SEEN Final   Clue Cells Wet Prep HPF POC PRESENT (A) NONE SEEN Final   WBC, Wet Prep HPF POC MANY (A) NONE SEEN Final   Sperm NONE SEEN  Final    Comment: Performed at [facility] [place], [address] [place], [place] [address]   UCx results likely reflective of contaminant, given no specific urinary sx and sample too bloody to read UA.  No treatment needed.  ED Provider: Leary RocaMichael Maczis, PA-C  Roderic ScarceErin N. Zigmund Hardin, PharmD PGY2 Infectious Diseases Pharmacy Resident Phone: [phone] 09/19/2017, 9:22 AM

## 2017-09-23 ENCOUNTER — Encounter: Payer: Self-pay | Admitting: Obstetrics & Gynecology

## 2017-09-23 ENCOUNTER — Other Ambulatory Visit (HOSPITAL_COMMUNITY)
Admission: RE | Admit: 2017-09-23 | Discharge: 2017-09-23 | Disposition: A | Discharge status: Home / Self Care | Payer: Medicaid Other | Source: Ambulatory Visit | Attending: Obstetrics & Gynecology | Admitting: Obstetrics & Gynecology

## 2017-09-23 ENCOUNTER — Ambulatory Visit (INDEPENDENT_AMBULATORY_CARE_PROVIDER_SITE_OTHER): Payer: Medicaid Other | Admitting: Obstetrics & Gynecology

## 2017-09-23 VITALS — BP 120/80 | HR 102 | Ht 64.0 in | Wt 147.0 lb

## 2017-09-23 DIAGNOSIS — E876 Hypokalemia: Secondary | ICD-10-CM

## 2017-09-23 DIAGNOSIS — N898 Other specified noninflammatory disorders of vagina: Secondary | ICD-10-CM | POA: Insufficient documentation

## 2017-09-23 DIAGNOSIS — Z3009 Encounter for other general counseling and advice on contraception: Secondary | ICD-10-CM

## 2017-09-23 DIAGNOSIS — D500 Iron deficiency anemia secondary to blood loss (chronic): Secondary | ICD-10-CM

## 2017-09-23 NOTE — Progress Notes (Signed)
History:  [name] here today for problems related to EAB. She is s/p an el;ective termination on 09/13/2017. She was seen in the ED for bleeding and pain which have both improved. She was noted there to have low potassium. She denies dizziness but, she reports 'hearing her pulse in her ears.'    The following portions of the patient's history were reviewed and updated as appropriate: allergies, current medications, past family history, past medical history, past social history, past surgical history and problem list.  Review of Systems:  Pertinent items are noted in HPI.    Objective:  Physical Exam Blood pressure 120/80, pulse (!) 102, height 5\' 4"  (1.626 m), weight 147 lb (66.7 kg), not currently breastfeeding.  CONSTITUTIONAL: Well-developed, well-nourished female in no acute distress.  HENT:  Normocephalic, atraumatic EYES: Conjunctivae and EOM are normal. No scleral icterus.  NECK: Normal range of motion SKIN: Skin is warm and dry. No rash noted. Not diaphoretic.No pallor. NEUROLGIC: Alert and oriented to person, place, and time. Normal coordination.  Abd: Soft, nontender and nondistended Pelvic: Normal appearing external genitalia; normal appearing vaginal mucosa and cervix.  Normal discharge.  Small uterus, no other palpable masses, no uterine or adnexal tenderness  Labs and Imaging [place] Ob Comp < 14 Wks  Result Date: 09/17/2017 CLINICAL DATA:  22 y/o F; severe cramping and bleeding. Medical abortion on Saturday and Sunday. History of right oophorectomy. EXAM: OBSTETRIC <14 WK US AND TRANSVAGINAL OB US TECHNIQUE: Both transabdominal and transvaginal ultrasound examinations were performed for complete evaluation of the gestation as well as the maternal uterus, adnexal regions, and pelvic cul-de-sac. Transvaginal technique was performed to assess early pregnancy. COMPARISON:  None. FINDINGS: Intrauterine gestational sac: None Yolk sac:  Not Visualized. Embryo:  Not Visualized.  Cardiac Activity: Not Visualized. Subchorionic hemorrhage:  None visualized. Maternal uterus/adnexae: Right ovary is surgically absent. Thickened 20 mm endometrium with heterogeneous contents compatible with recent abortion. Increased blood flow within the endometrium may represent retained products of conception. Oblong nodular structure in the left adnexa along the course of the iliac vessels adjacent to the left ovary measuring 2.0 x 0.8 x 1.1 cm, likely lymph node. IMPRESSION: 1. No intrauterine or extrauterine pregnancy identified. 2. Thickened endometrium with heterogeneous contents. Increased blood flow within the endometrium may represent retained products of conception. 3. Incidental note of a prominent lymph node in the left pelvis. Electronically Signed   By: Mitzi HansenLance  Furusawa-Stratton M.D.   On: 09/17/2017 16:23   [place] Ob Transvaginal  Result Date: 09/17/2017 CLINICAL DATA:  22 y/o F; severe cramping and bleeding. Medical abortion on Saturday and Sunday. History of right oophorectomy. EXAM: OBSTETRIC <14 WK US AND TRANSVAGINAL OB US TECHNIQUE: Both transabdominal and transvaginal ultrasound examinations were performed for complete evaluation of the gestation as well as the maternal uterus, adnexal regions, and pelvic cul-de-sac. Transvaginal technique was performed to assess early pregnancy. COMPARISON:  None. FINDINGS: Intrauterine gestational sac: None Yolk sac:  Not Visualized. Embryo:  Not Visualized. Cardiac Activity: Not Visualized. Subchorionic hemorrhage:  None visualized. Maternal uterus/adnexae: Right ovary is surgically absent. Thickened 20 mm endometrium with heterogeneous contents compatible with recent abortion. Increased blood flow within the endometrium may represent retained products of conception. Oblong nodular structure in the left adnexa along the course of the iliac vessels adjacent to the left ovary measuring 2.0 x 0.8 x 1.1 cm, likely lymph node. IMPRESSION: 1. No intrauterine or  extrauterine pregnancy identified. 2. Thickened endometrium with heterogeneous contents. Increased blood flow within  the endometrium may represent retained products of conception. 3. Incidental note of a prominent lymph node in the left pelvis. Electronically Signed   By: Mitzi Hansen M.D.   On: 09/17/2017 16:23    Assessment & Plan:  Discharge after EAB  F/u cx   Hypokalemia   Etiology unknown  Cont replacement. If still low need to increase dosage of Kdur.  Anemia  CBC today  contraception management   Abstinece until IUD  F/u in 3 weeks for LnIUD  [name], M.D., [facility]

## 2017-09-23 NOTE — Progress Notes (Signed)
Patient had medical abortion on 09-12-17. Patient has been having cramping and pain and was seen in ED on 09-17-17 where they told her her potassium was very low.  Patient also desire IUD. Armandina StammerJennifer Howard RN

## 2017-09-23 NOTE — Patient Instructions (Signed)
Discahrge after Levonorgestrel intrauterine device (IUD) What is this medicine? LEVONORGESTREL IUD (LEE voe nor jes trel) is a contraceptive (birth control) device. The device is placed inside the uterus by a healthcare professional. It is used to prevent pregnancy. This device can also be used to treat heavy bleeding that occurs during your period. This medicine may be used for other purposes; ask your health care provider or pharmacist if you have questions. COMMON BRAND NAME(S): Cameron AliKyleena, LILETTA, Mirena, Skyla What should I tell my health care provider before I take this medicine? They need to know if you have any of these conditions: -abnormal Pap smear -cancer of the breast, uterus, or cervix -diabetes -endometritis -genital or pelvic infection now or in the past -have more than one sexual partner or your partner has more than one partner -heart disease -history of an ectopic or tubal pregnancy -immune system problems -IUD in place -liver disease or tumor -problems with blood clots or take blood-thinners -seizures -use intravenous drugs -uterus of unusual shape -vaginal bleeding that has not been explained -an unusual or allergic reaction to levonorgestrel, other hormones, silicone, or polyethylene, medicines, foods, dyes, or preservatives -pregnant or trying to get pregnant -breast-feeding How should I use this medicine? This device is placed inside the uterus by a health care professional. Talk to your pediatrician regarding the use of this medicine in children. Special care may be needed. Overdosage: If you think you have taken too much of this medicine contact a poison control center or emergency room at once. NOTE: This medicine is only for you. Do not share this medicine with others. What if I miss a dose? This does not apply. Depending on the brand of device you have inserted, the device will need to be replaced every 3 to 5 years if you wish to continue using this type of  birth control. What may interact with this medicine? Do not take this medicine with any of the following medications: -amprenavir -bosentan -fosamprenavir This medicine may also interact with the following medications: -aprepitant -armodafinil -barbiturate medicines for inducing sleep or treating seizures -bexarotene -boceprevir -griseofulvin -medicines to treat seizures like carbamazepine, ethotoin, felbamate, oxcarbazepine, phenytoin, topiramate -modafinil -pioglitazone -rifabutin -rifampin -rifapentine -some medicines to treat HIV infection like atazanavir, efavirenz, indinavir, lopinavir, nelfinavir, tipranavir, ritonavir -St. John's wort -warfarin This list may not describe all possible interactions. Give your health care provider a list of all the medicines, herbs, non-prescription drugs, or dietary supplements you use. Also tell them if you smoke, drink alcohol, or use illegal drugs. Some items may interact with your medicine. What should I watch for while using this medicine? Visit your doctor or health care professional for regular check ups. See your doctor if you or your partner has sexual contact with others, becomes HIV positive, or gets a sexual transmitted disease. This product does not protect you against HIV infection (AIDS) or other sexually transmitted diseases. You can check the placement of the IUD yourself by reaching up to the top of your vagina with clean fingers to feel the threads. Do not pull on the threads. It is a good habit to check placement after each menstrual period. Call your doctor right away if you feel more of the IUD than just the threads or if you cannot feel the threads at all. The IUD may come out by itself. You may become pregnant if the device comes out. If you notice that the IUD has come out use a backup birth control method like condoms and  call your health care provider. Using tampons will not change the position of the IUD and are okay to  use during your period. This IUD can be safely scanned with magnetic resonance imaging (MRI) only under specific conditions. Before you have an MRI, tell your healthcare provider that you have an IUD in place, and which type of IUD you have in place. What side effects may I notice from receiving this medicine? Side effects that you should report to your doctor or health care professional as soon as possible: -allergic reactions like skin rash, itching or hives, swelling of the face, lips, or tongue -fever, flu-like symptoms -genital sores -high blood pressure -no menstrual period for 6 weeks during use -pain, swelling, warmth in the leg -pelvic pain or tenderness -severe or sudden headache -signs of pregnancy -stomach cramping -sudden shortness of breath -trouble with balance, talking, or walking -unusual vaginal bleeding, discharge -yellowing of the eyes or skin Side effects that usually do not require medical attention (report to your doctor or health care professional if they continue or are bothersome): -acne -breast pain -change in sex drive or performance -changes in weight -cramping, dizziness, or faintness while the device is being inserted -headache -irregular menstrual bleeding within first 3 to 6 months of use -nausea This list may not describe all possible side effects. Call your doctor for medical advice about side effects. You may report side effects to FDA at 1-800-FDA-1088. Where should I keep my medicine? This does not apply. NOTE: This sheet is a summary. It may not cover all possible information. If you have questions about this medicine, talk to your doctor, pharmacist, or health care provider.  2018 Elsevier/Gold Standard (2015-10-26 14:14:56)

## 2017-09-24 LAB — BASIC METABOLIC PANEL
BUN / CREAT RATIO: 24 — AB (ref 9–23)
BUN: 15 mg/dL (ref 6–20)
CALCIUM: 9.3 mg/dL (ref 8.7–10.2)
CHLORIDE: 103 mmol/L (ref 96–106)
CO2: 20 mmol/L (ref 20–29)
Creatinine, Ser: 0.62 mg/dL (ref 0.57–1.00)
GFR calc non Af Amer: 128 mL/min/{1.73_m2} (ref >59)
GFR, EST AFRICAN AMERICAN: 148 mL/min/{1.73_m2} (ref >59)
GLUCOSE: 87 mg/dL (ref 65–99)
POTASSIUM: 4.3 mmol/L (ref 3.5–5.2)
Sodium: 142 mmol/L (ref 134–144)

## 2017-09-24 LAB — CBC
HEMATOCRIT: 29.9 % — AB (ref 34.0–46.6)
Hemoglobin: 8.8 g/dL — ABNORMAL LOW (ref 11.1–15.9)
MCH: 23.7 pg — ABNORMAL LOW (ref 26.6–33.0)
MCHC: 29.4 g/dL — AB (ref 31.5–35.7)
MCV: 80 fL (ref 79–97)
Platelets: 333 10*3/uL (ref 150–450)
RBC: 3.72 x10E6/uL — ABNORMAL LOW (ref 3.77–5.28)
RDW: 16.9 % — AB (ref 12.3–15.4)
WBC: 7.5 10*3/uL (ref 3.4–10.8)

## 2017-09-25 ENCOUNTER — Other Ambulatory Visit: Payer: Self-pay | Admitting: Obstetrics & Gynecology

## 2017-09-25 ENCOUNTER — Telehealth: Payer: Self-pay

## 2017-09-25 DIAGNOSIS — B9689 Other specified bacterial agents as the cause of diseases classified elsewhere: Secondary | ICD-10-CM

## 2017-09-25 DIAGNOSIS — N760 Acute vaginitis: Secondary | ICD-10-CM

## 2017-09-25 LAB — CERVICOVAGINAL ANCILLARY ONLY
BACTERIAL VAGINITIS: NEGATIVE
CANDIDA VAGINITIS: NEGATIVE
Chlamydia: NEGATIVE
Neisseria Gonorrhea: NEGATIVE
Trichomonas: NEGATIVE

## 2017-09-25 MED ORDER — METRONIDAZOLE 500 MG PO TABS: 500.0000 mg | ORAL_TABLET | Freq: Two times a day (BID) | ORAL | 0 refills | Status: DC

## 2017-09-25 NOTE — Telephone Encounter (Signed)
Attempted to reach patient by phone and "phone not in service" Erin StammerJennifer Charlisa Hardin

## 2017-09-25 NOTE — Progress Notes (Signed)
Rx for flagyl sen tot he pharmacy.   Dung Salinger L. Harraway-Smith, M.D., [facility] CoreFACOG

## 2017-09-26 ENCOUNTER — Telehealth: Payer: Self-pay

## 2017-09-26 NOTE — Care Management (Signed)
Received call from patient who states she cannot access her Rx from pharmacy that is closed. Reviewed documents and see Flagyl 500 BID #[id number] was called in yesterday by Dr Sherre Scarlet Harraway-Smith. [facility] [phone], and this CM called in Rx for patient to be able to pick it up later today.

## 2017-10-14 ENCOUNTER — Ambulatory Visit (INDEPENDENT_AMBULATORY_CARE_PROVIDER_SITE_OTHER): Payer: Medicaid Other | Admitting: Obstetrics & Gynecology

## 2017-10-14 ENCOUNTER — Other Ambulatory Visit (HOSPITAL_COMMUNITY)
Admission: RE | Admit: 2017-10-14 | Discharge: 2017-10-14 | Disposition: A | Discharge status: Home / Self Care | Payer: Medicaid Other | Source: Ambulatory Visit | Attending: Obstetrics & Gynecology | Admitting: Obstetrics & Gynecology

## 2017-10-14 ENCOUNTER — Encounter: Payer: Self-pay | Admitting: Obstetrics & Gynecology

## 2017-10-14 VITALS — BP 118/76 | HR 99 | Ht 64.0 in | Wt 148.0 lb

## 2017-10-14 DIAGNOSIS — Z30430 Encounter for insertion of intrauterine contraceptive device: Secondary | ICD-10-CM | POA: Diagnosis present

## 2017-10-14 DIAGNOSIS — N3000 Acute cystitis without hematuria: Secondary | ICD-10-CM | POA: Diagnosis not present

## 2017-10-14 LAB — POCT URINALYSIS DIPSTICK OB
KETONES UA: NEGATIVE
NITRITE UA: NEGATIVE

## 2017-10-14 MED ORDER — FLUCONAZOLE 150 MG PO TABS: 150.0000 mg | ORAL_TABLET | Freq: Once | ORAL | 0 refills | Status: AC

## 2017-10-14 MED ORDER — SULFAMETHOXAZOLE-TRIMETHOPRIM 800-160 MG PO TABS: 1.0000 | ORAL_TABLET | Freq: Two times a day (BID) | ORAL | 0 refills | Status: DC

## 2017-10-14 MED ORDER — LEVONORGESTREL 19.5 MCG/DAY IU IUD
INTRAUTERINE_SYSTEM | Freq: Once | INTRAUTERINE | Status: AC
  Administered 2017-10-14: 1 via INTRAUTERINE

## 2017-10-14 NOTE — Patient Instructions (Signed)

## 2017-10-14 NOTE — Addendum Note (Signed)
Addended by: Lorelle GibbsWILSON, CHIQUITA L on: 10/14/2017 03:15 PM   Modules accepted: Orders

## 2017-10-14 NOTE — Progress Notes (Signed)
GYNECOLOGY CLINIC PROCEDURE NOTE  [name] is a [name] here for Mirena IUD insertion. LMP: on menses currently  No GYN concerns.  Pt has never had a PAP due to her age. She reports dysuria and urinary freq.    IUD Insertion Procedure Note Patient identified, informed consent performed.  Discussed risks of irregular bleeding, cramping, infection, malpositioning or misplacement of the IUD outside the uterus which may require further procedures. Time out was performed.  Urine pregnancy test negative.  Speculum placed in the vagina.  Cervix visualized.  Cleaned with Betadine x 2.  Grasped anteriorly with a single tooth tenaculum.  Uterus sounded to 9 cm.  Mirena IUD placed per manufacturer's recommendations.  Strings trimmed to 3 cm. Tenaculum was removed, good hemostasis noted.  Patient tolerated procedure well.   Patient was given post-procedure instructions.  Patient was asked to follow up in 4 weeks for IUD check.  UTI:  Bactrim DS 1 po bid x 3 days  Coree Brame L. Harraway-Smith, M.D., [facility] CoreFACOG

## 2017-10-20 LAB — CYTOLOGY - PAP
DIAGNOSIS: UNDETERMINED — AB
HPV: DETECTED — AB

## 2017-11-11 ENCOUNTER — Encounter: Payer: Self-pay | Admitting: Obstetrics & Gynecology

## 2017-11-11 ENCOUNTER — Ambulatory Visit (INDEPENDENT_AMBULATORY_CARE_PROVIDER_SITE_OTHER): Payer: Medicaid Other | Admitting: Obstetrics & Gynecology

## 2017-11-11 VITALS — BP 106/69 | HR 88 | Ht 64.0 in | Wt 149.0 lb

## 2017-11-11 DIAGNOSIS — Z30431 Encounter for routine checking of intrauterine contraceptive device: Secondary | ICD-10-CM

## 2017-11-11 NOTE — Progress Notes (Signed)
History:  [name] here today for today for IUD string check; Mirena IUD was placed 10/14/2017. No complaints about the Mirena, no concerning side effects. She is spotting daily.   The following portions of the patient's history were reviewed and updated as appropriate: allergies, current medications, past family history, past medical history, past social history, past surgical history and problem list. Last pap smear on 10/14/2017 was normal, ASCUS pos- HRHPV.  Review of Systems:  Pertinent items are noted in HPI.   Objective:  Physical Exam Blood pressure 106/69, pulse 88, height 5\' 4"  (1.626 m), weight 149 lb (67.6 kg), not currently breastfeeding. Gen: NAD Abd: Soft, nontender and nondistended Pelvic: Normal appearing external genitalia; normal appearing vaginal mucosa and cervix.  IUD strings visualized, about 4 cm in length outside cervix.   Assessment & Plan:  Normal IUD check. Patient to keep IUD in place for five years; can come in for removal if she desires pregnancy within the next five years.  Abnormal PAP- we have attempted to reach her and could not. Pt does not want to have it done today. To be scheduled for a colposcopy    Routine preventative health maintenance measures emphasized.  Total face-to-face time with patient was 10 min.  Greater than 50% was spent in counseling and coordination of care with the patient.    Danelly Hassinger L. Harraway-Smith, M.D., [facility]

## 2017-12-09 ENCOUNTER — Encounter: Payer: Self-pay | Admitting: Obstetrics & Gynecology

## 2018-01-20 ENCOUNTER — Other Ambulatory Visit: Payer: Self-pay

## 2018-01-20 ENCOUNTER — Encounter (HOSPITAL_COMMUNITY): Payer: Self-pay

## 2018-01-20 ENCOUNTER — Emergency Department (HOSPITAL_BASED_OUTPATIENT_CLINIC_OR_DEPARTMENT_OTHER)
Admission: EM | Admit: 2018-01-20 | Discharge: 2018-01-20 | Disposition: A | Discharge status: Home / Self Care | Payer: Medicaid Other | Attending: Emergency Medicine | Admitting: Emergency Medicine

## 2018-01-20 ENCOUNTER — Encounter (HOSPITAL_BASED_OUTPATIENT_CLINIC_OR_DEPARTMENT_OTHER): Payer: Self-pay | Admitting: Emergency Medicine

## 2018-01-20 ENCOUNTER — Inpatient Hospital Stay (EMERGENCY_DEPARTMENT_HOSPITAL)
Admission: AD | Admit: 2018-01-20 | Discharge: 2018-01-20 | Disposition: A | Discharge status: Home / Self Care | Payer: Medicaid Other | Source: Ambulatory Visit | Attending: Obstetrics & Gynecology | Admitting: Obstetrics & Gynecology

## 2018-01-20 ENCOUNTER — Inpatient Hospital Stay (HOSPITAL_COMMUNITY)
Admission: AD | Admit: 2018-01-20 | Discharge: 2018-01-20 | Disposition: A | Discharge status: Home / Self Care | Payer: Medicaid Other | Source: Ambulatory Visit | Attending: Obstetrics & Gynecology | Admitting: Obstetrics & Gynecology

## 2018-01-20 ENCOUNTER — Emergency Department (HOSPITAL_BASED_OUTPATIENT_CLINIC_OR_DEPARTMENT_OTHER): Payer: Medicaid Other

## 2018-01-20 DIAGNOSIS — R1030 Lower abdominal pain, unspecified: Secondary | ICD-10-CM | POA: Insufficient documentation

## 2018-01-20 DIAGNOSIS — T8384XA Pain from genitourinary prosthetic devices, implants and grafts, initial encounter: Secondary | ICD-10-CM | POA: Insufficient documentation

## 2018-01-20 DIAGNOSIS — T8332XA Displacement of intrauterine contraceptive device, initial encounter: Secondary | ICD-10-CM | POA: Diagnosis not present

## 2018-01-20 DIAGNOSIS — N83202 Unspecified ovarian cyst, left side: Secondary | ICD-10-CM | POA: Diagnosis not present

## 2018-01-20 DIAGNOSIS — R1032 Left lower quadrant pain: Secondary | ICD-10-CM | POA: Diagnosis present

## 2018-01-20 DIAGNOSIS — R109 Unspecified abdominal pain: Secondary | ICD-10-CM | POA: Diagnosis not present

## 2018-01-20 DIAGNOSIS — Z30432 Encounter for removal of intrauterine contraceptive device: Secondary | ICD-10-CM | POA: Insufficient documentation

## 2018-01-20 DIAGNOSIS — Z79899 Other long term (current) drug therapy: Secondary | ICD-10-CM | POA: Diagnosis not present

## 2018-01-20 LAB — COMPREHENSIVE METABOLIC PANEL
ALT: 19 U/L (ref 0–44)
AST: 67 U/L — ABNORMAL HIGH (ref 15–41)
Albumin: 4.5 g/dL (ref 3.5–5.0)
Alkaline Phosphatase: 62 U/L (ref 38–126)
Anion gap: 9 (ref 5–15)
BUN: 12 mg/dL (ref 6–20)
CO2: 17 mmol/L — ABNORMAL LOW (ref 22–32)
Calcium: 8.7 mg/dL — ABNORMAL LOW (ref 8.9–10.3)
Chloride: 108 mmol/L (ref 98–111)
Creatinine, Ser: 0.82 mg/dL (ref 0.44–1.00)
GFR calc Af Amer: >60 mL/min (ref >60)
GFR calc non Af Amer: >60 mL/min (ref >60)
Glucose, Bld: 130 mg/dL — ABNORMAL HIGH (ref 70–99)
Potassium: 4.5 mmol/L (ref 3.5–5.1)
Sodium: 134 mmol/L — ABNORMAL LOW (ref 135–145)
Total Bilirubin: 1.2 mg/dL (ref 0.3–1.2)
Total Protein: 7 g/dL (ref 6.5–8.1)

## 2018-01-20 LAB — HCG, QUANTITATIVE, PREGNANCY: hCG, Beta Chain, Quant, S: <1 m[IU]/mL (ref <5)

## 2018-01-20 LAB — CBC WITH DIFFERENTIAL/PLATELET
Abs Immature Granulocytes: 0.01 10*3/uL (ref 0.00–0.07)
Basophils Absolute: 0 10*3/uL (ref 0.0–0.1)
Basophils Relative: 0 %
Eosinophils Absolute: 0 10*3/uL (ref 0.0–0.5)
Eosinophils Relative: 0 %
HCT: 31.3 % — ABNORMAL LOW (ref 36.0–46.0)
Hemoglobin: 8.6 g/dL — ABNORMAL LOW (ref 12.0–15.0)
Immature Granulocytes: 0 %
Lymphocytes Relative: 32 %
Lymphs Abs: 2.5 10*3/uL (ref 0.7–4.0)
MCH: 19.4 pg — ABNORMAL LOW (ref 26.0–34.0)
MCHC: 27.5 g/dL — ABNORMAL LOW (ref 30.0–36.0)
MCV: 70.5 fL — ABNORMAL LOW (ref 80.0–100.0)
Monocytes Absolute: 0.5 10*3/uL (ref 0.1–1.0)
Monocytes Relative: 7 %
Neutro Abs: 4.9 10*3/uL (ref 1.7–7.7)
Neutrophils Relative %: 61 %
Platelets: 247 10*3/uL (ref 150–400)
RBC: 4.44 MIL/uL (ref 3.87–5.11)
RDW: 21.4 % — ABNORMAL HIGH (ref 11.5–15.5)
WBC: 8 10*3/uL (ref 4.0–10.5)
nRBC: 0 % (ref 0.0–0.2)

## 2018-01-20 LAB — WET PREP, GENITAL
Sperm: NONE SEEN
Trich, Wet Prep: NONE SEEN
Yeast Wet Prep HPF POC: NONE SEEN

## 2018-01-20 MED ORDER — KETOROLAC TROMETHAMINE 30 MG/ML IJ SOLN
30.0000 mg | Freq: Once | INTRAMUSCULAR | Status: AC
  Administered 2018-01-20: 30 mg via INTRAVENOUS
  Filled 2018-01-20: qty 1

## 2018-01-20 MED ORDER — IBUPROFEN 800 MG PO TABS: 800.0000 mg | ORAL_TABLET | Freq: Three times a day (TID) | ORAL | 0 refills | Status: DC

## 2018-01-20 MED ORDER — ONDANSETRON HCL 4 MG/2ML IJ SOLN
4.0000 mg | Freq: Once | INTRAMUSCULAR | Status: AC
  Administered 2018-01-20: 4 mg via INTRAVENOUS
  Filled 2018-01-20: qty 2

## 2018-01-20 MED ORDER — HYDROMORPHONE HCL 1 MG/ML IJ SOLN
1.0000 mg | Freq: Once | INTRAMUSCULAR | Status: AC
  Administered 2018-01-20: 1 mg via INTRAVENOUS
  Filled 2018-01-20: qty 1

## 2018-01-20 MED ORDER — OXYCODONE-ACETAMINOPHEN 5-325 MG PO TABS
2.0000 | ORAL_TABLET | Freq: Once | ORAL | Status: AC
  Administered 2018-01-20: 2 via ORAL
  Filled 2018-01-20: qty 2

## 2018-01-20 MED ORDER — ONDANSETRON HCL 4 MG PO TABS: 4.0000 mg | ORAL_TABLET | Freq: Four times a day (QID) | ORAL | 0 refills | Status: DC

## 2018-01-20 MED ORDER — SODIUM CHLORIDE 0.9 % IV BOLUS
1000.0000 mL | Freq: Once | INTRAVENOUS | Status: AC
  Administered 2018-01-20: 1000 mL via INTRAVENOUS

## 2018-01-20 MED ORDER — OXYCODONE-ACETAMINOPHEN 5-325 MG PO TABS: 1.0000 | ORAL_TABLET | Freq: Four times a day (QID) | ORAL | 0 refills | Status: DC | PRN

## 2018-01-20 NOTE — ED Notes (Signed)
Patient transported to Ultrasound 

## 2018-01-20 NOTE — ED Notes (Signed)
Pt provided heat packs to right flank/abdomen

## 2018-01-20 NOTE — ED Provider Notes (Signed)
[facility] EMERGENCY DEPARTMENT Provider Note   CSN: [id number] Arrival date & time: 01/20/18  1224     History   Chief Complaint Chief Complaint  Patient presents with  . Abdominal Pain    lower left    HPI [name] is a 22 y.o. female with history of ovarian cysts and oophorectomy status post ovarian torsion who presents with severe left lower abdominal pain.  Patient has had associated vomiting secondary to the pain.  She denies any fevers, chest pain, shortness of breath, diarrhea, urinary symptoms, abnormal vaginal bleeding or discharge.  She has IUD for birth control.  She has no concern for pregnancy.  Patient did not take any medication prior to arrival.  She reports her pain started about 20 to 30 minutes PTA.  HPI  Past Medical History:  Diagnosis Date  . Eczema     There are no active problems to display for this patient.   Past Surgical History:  Procedure Laterality Date  . ovary removed       OB History    Gravida  2   Para  1   Term  1   Preterm      AB  1   Living  1     SAB      TAB  1   Ectopic      Multiple  0   Live Births  1            Home Medications    Prior to Admission medications   Medication Sig Start Date End Date Taking? Authorizing Provider  acetaminophen (TYLENOL) 500 MG tablet Take 500 mg by mouth every 6 (six) hours as needed.    [provider]  calcium carbonate (TUMS - DOSED IN MG ELEMENTAL CALCIUM) 500 MG chewable tablet Chew 1 tablet by mouth daily.    [provider]  ibuprofen (ADVIL,MOTRIN) 800 MG tablet Take 1 tablet (800 mg total) by mouth 3 (three) times daily. 01/20/18   Aariah Godette, Waylan BogaAlexandra M, PA-C  metroNIDAZOLE (FLAGYL) 500 MG tablet Take 1 tablet (500 mg total) by mouth 2 (two) times daily. Patient not taking: Reported on 10/14/2017 09/25/17   Willodean RosenthalHarraway-Smith, Carolyn, MD  norethindrone (ORTHO MICRONOR) 0.35 MG tablet Take 1 tablet (0.35 mg total) by mouth  daily. Patient not taking: Reported on 09/23/2017 06/26/16   Levie HeritageStinson, Jacob J, DO  ondansetron (ZOFRAN) 4 MG tablet Take 1 tablet (4 mg total) by mouth every 6 (six) hours. 01/20/18   Emi HolesLaw, Gorge Almanza M, PA-C  oxyCODONE-acetaminophen (PERCOCET/ROXICET) 5-325 MG tablet Take 1-2 tablets by mouth every 6 (six) hours as needed for severe pain. 01/20/18   Emi HolesLaw, Zitlaly Malson M, PA-C    Family History Family History  Problem Relation Age of Onset  . Aneurysm Mother   . Fibroids Mother     Social History Social History   Tobacco Use  . Smoking status: Never Smoker  . Smokeless tobacco: Never Used  Substance Use Topics  . Alcohol use: No  . Drug use: No     Allergies   Patient has no known allergies.   Review of Systems Review of Systems  Constitutional: Negative for chills and fever.  HENT: Negative for facial swelling and sore throat.   Respiratory: Negative for shortness of breath.   Cardiovascular: Negative for chest pain.  Gastrointestinal: Positive for abdominal pain. Negative for diarrhea, nausea and vomiting.  Genitourinary: Negative for dysuria, flank pain and frequency.  Musculoskeletal: Negative for back  pain.  Skin: Negative for rash and wound.  Neurological: Negative for headaches.  Psychiatric/Behavioral: The patient is not nervous/anxious.      Physical Exam Updated Vital Signs BP 130/78 (BP Location: Right Arm)   Pulse 68   Temp 97.9 F (36.6 C) (Oral)   Resp 16   LMP 12/21/2017   SpO2 100%   Physical Exam Vitals signs and nursing note reviewed.  Constitutional:      General: She is not in acute distress.    Appearance: She is well-developed. She is not diaphoretic.     Comments: Patient constantly yelling and moaning in pain, cannot sit still  HENT:     Head: Normocephalic and atraumatic.     Mouth/Throat:     Pharynx: No oropharyngeal exudate.  Eyes:     General: No scleral icterus.       Right eye: No discharge.        Left eye: No discharge.      Conjunctiva/sclera: Conjunctivae normal.     Pupils: Pupils are equal, round, and reactive to light.  Neck:     Musculoskeletal: Normal range of motion and neck supple.     Thyroid: No thyromegaly.  Cardiovascular:     Rate and Rhythm: Normal rate and regular rhythm.     Heart sounds: Normal heart sounds. No murmur. No friction rub. No gallop.   Pulmonary:     Effort: Pulmonary effort is normal. No respiratory distress.     Breath sounds: Normal breath sounds. No stridor. No wheezing or rales.  Abdominal:     General: Bowel sounds are normal. There is no distension.     Palpations: Abdomen is soft.     Tenderness: There is abdominal tenderness in the left lower quadrant. There is no right CVA tenderness, left CVA tenderness, guarding or rebound.  Genitourinary:    Cervix: No cervical motion tenderness.     Uterus: Normal.      Adnexa:        Right: No tenderness.         Left: No tenderness.       Comments: IUD strings are 7 to 8 cm into the vaginal vault Lymphadenopathy:     Cervical: No cervical adenopathy.  Skin:    General: Skin is warm and dry.     Coloration: Skin is not pale.     Findings: No rash.  Neurological:     Mental Status: She is alert.     Coordination: Coordination normal.      ED Treatments / Results  Labs (all labs ordered are listed, but only abnormal results are displayed) Labs Reviewed  WET PREP, GENITAL - Abnormal; Notable for the following components:      Result Value   Clue Cells Wet Prep HPF POC PRESENT (*)    WBC, Wet Prep HPF POC MANY (*)    All other components within normal limits  COMPREHENSIVE METABOLIC PANEL - Abnormal; Notable for the following components:   Sodium 134 (*)    CO2 17 (*)    Glucose, Bld 130 (*)    Calcium 8.7 (*)    AST 67 (*)    All other components within normal limits  CBC WITH DIFFERENTIAL/PLATELET - Abnormal; Notable for the following components:   Hemoglobin 8.6 (*)    HCT 31.3 (*)    MCV 70.5 (*)    MCH  19.4 (*)    MCHC 27.5 (*)    RDW 21.4 (*)  All other components within normal limits  HCG, QUANTITATIVE, PREGNANCY  GC/CHLAMYDIA PROBE AMP ([place]) NOT AT [facility]    EKG None  Radiology US Transvaginal Non-ob  Result Date: 01/20/2018 CLINICAL DATA:  Sudden onset left adnexal pain. Previous right nephrectomy. IUD placed 3 months ago. EXAM: TRANSABDOMINAL AND TRANSVAGINAL ULTRASOUND OF PELVIS DOPPLER ULTRASOUND OF OVARIES TECHNIQUE: Both transabdominal and transvaginal ultrasound examinations of the pelvis were performed. Transabdominal technique was performed for global imaging of the pelvis including uterus, ovaries, adnexal regions, and pelvic cul-de-sac. It was necessary to proceed with endovaginal exam following the transabdominal exam to visualize the endometrium and ovaries. Color and duplex Doppler ultrasound was utilized to evaluate blood flow to the ovaries. COMPARISON:  09/17/2017 FINDINGS: Uterus Measurements: 4.7 x 5.9 x 8.6 cm = volume: 125 mL. No fibroids or other mass visualized. IUD is low position over the endometrial canal of the luteal lower uterine segment extending towards the cervical region. Endometrium Thickness: 6 mm.  No focal abnormality visualized. Right ovary Previous right oophorectomy.  No right adnexal abnormality. Left ovary Measurements: 3.9 x 6.1 x 7.0 cm = volume: 87 mL. Simple 4.8 cm cyst with no focal solid component or internal vascularity. Pulsed Doppler evaluation of the left ovary demonstrates normal low-resistance arterial and venous waveforms. Other findings Moderate free simple pelvic fluid. IMPRESSION: 4.8 cm simple left ovarian cyst. Normal left ovarian vascularity. Moderate amount of free simple appearing pelvic fluid. Normal uterus and endometrium. Previous right oophorectomy. Note that patient's IUD is low over the endometrial canal in the lower uterine segment extending towards the cervical region. Electronically Signed   By: Elberta Fortis M.D.    On: 01/20/2018 13:59   US Pelvis Complete  Result Date: 01/20/2018 CLINICAL DATA:  Sudden onset left adnexal pain. Previous right nephrectomy. IUD placed 3 months ago. EXAM: TRANSABDOMINAL AND TRANSVAGINAL ULTRASOUND OF PELVIS DOPPLER ULTRASOUND OF OVARIES TECHNIQUE: Both transabdominal and transvaginal ultrasound examinations of the pelvis were performed. Transabdominal technique was performed for global imaging of the pelvis including uterus, ovaries, adnexal regions, and pelvic cul-de-sac. It was necessary to proceed with endovaginal exam following the transabdominal exam to visualize the endometrium and ovaries. Color and duplex Doppler ultrasound was utilized to evaluate blood flow to the ovaries. COMPARISON:  09/17/2017 FINDINGS: Uterus Measurements: 4.7 x 5.9 x 8.6 cm = volume: 125 mL. No fibroids or other mass visualized. IUD is low position over the endometrial canal of the luteal lower uterine segment extending towards the cervical region. Endometrium Thickness: 6 mm.  No focal abnormality visualized. Right ovary Previous right oophorectomy.  No right adnexal abnormality. Left ovary Measurements: 3.9 x 6.1 x 7.0 cm = volume: 87 mL. Simple 4.8 cm cyst with no focal solid component or internal vascularity. Pulsed Doppler evaluation of the left ovary demonstrates normal low-resistance arterial and venous waveforms. Other findings Moderate free simple pelvic fluid. IMPRESSION: 4.8 cm simple left ovarian cyst. Normal left ovarian vascularity. Moderate amount of free simple appearing pelvic fluid. Normal uterus and endometrium. Previous right oophorectomy. Note that patient's IUD is low over the endometrial canal in the lower uterine segment extending towards the cervical region. Electronically Signed   By: Elberta Fortis M.D.   On: 01/20/2018 13:59   [place] Art/ven Flow Abd Pelv Doppler  Result Date: 01/20/2018 CLINICAL DATA:  Sudden onset left adnexal pain. Previous right nephrectomy. IUD placed 3 months  ago. EXAM: TRANSABDOMINAL AND TRANSVAGINAL ULTRASOUND OF PELVIS DOPPLER ULTRASOUND OF OVARIES TECHNIQUE: Both transabdominal and transvaginal ultrasound examinations  of the pelvis were performed. Transabdominal technique was performed for global imaging of the pelvis including uterus, ovaries, adnexal regions, and pelvic cul-de-sac. It was necessary to proceed with endovaginal exam following the transabdominal exam to visualize the endometrium and ovaries. Color and duplex Doppler ultrasound was utilized to evaluate blood flow to the ovaries. COMPARISON:  09/17/2017 FINDINGS: Uterus Measurements: 4.7 x 5.9 x 8.6 cm = volume: 125 mL. No fibroids or other mass visualized. IUD is low position over the endometrial canal of the luteal lower uterine segment extending towards the cervical region. Endometrium Thickness: 6 mm.  No focal abnormality visualized. Right ovary Previous right oophorectomy.  No right adnexal abnormality. Left ovary Measurements: 3.9 x 6.1 x 7.0 cm = volume: 87 mL. Simple 4.8 cm cyst with no focal solid component or internal vascularity. Pulsed Doppler evaluation of the left ovary demonstrates normal low-resistance arterial and venous waveforms. Other findings Moderate free simple pelvic fluid. IMPRESSION: 4.8 cm simple left ovarian cyst. Normal left ovarian vascularity. Moderate amount of free simple appearing pelvic fluid. Normal uterus and endometrium. Previous right oophorectomy. Note that patient's IUD is low over the endometrial canal in the lower uterine segment extending towards the cervical region. Electronically Signed   By: Elberta Fortis M.D.   On: 01/20/2018 13:59    Procedures Procedures (including critical care time)  Medications Ordered in ED Medications  sodium chloride 0.9 % bolus 1,000 mL (0 mLs Intravenous Stopped 01/20/18 1447)  ondansetron (ZOFRAN) injection 4 mg (4 mg Intravenous Given 01/20/18 1246)  HYDROmorphone (DILAUDID) injection 1 mg (1 mg Intravenous Given  01/20/18 1246)  HYDROmorphone (DILAUDID) injection 1 mg (1 mg Intravenous Given 01/20/18 1255)  ketorolac (TORADOL) 30 MG/ML injection 30 mg (30 mg Intravenous Given 01/20/18 1350)  HYDROmorphone (DILAUDID) injection 1 mg (1 mg Intravenous Given 01/20/18 1451)  oxyCODONE-acetaminophen (PERCOCET/ROXICET) 5-325 MG per tablet 2 tablet (2 tablets Oral Given 01/20/18 1511)     Initial Impression / Assessment and Plan / ED Course  I have reviewed the triage vital signs and the nursing notes.  Pertinent labs & imaging results that were available during my care of the patient were reviewed by me and considered in my medical decision making (see chart for details).     Patient with ovarian cyst visualized on ultrasound. There is normal vascularity to the remaining ovary (L). There is also moderate free pelvic fluid suggesting also possible cyst rupture. IUD is also low riding in the endometrial canal. This was confirmed on pelvic exam, as strings were longer than last IUD check, per chart review. HCG negative. Labs are stable for the patient. Stable chronic anemia. Wet prep shows clue cells, but considering no new discharge, will defer treatment to OB/GYN. Patient's pain very difficult to control throughout ED course. I discussed patient case with Ob/Gyn on call, Dr. Despina Hidden, who advised that patient could be discharged home with Percocet and NSAIDs and follow up to OB/GYN in the next few days. Patient finally have improved pain after Percocet in the ED. Return precautions discussed. Patient understands and agrees with plan. Patient vitals stable and discharged in satisfactory condition. I discussed patient case with Dr. Ethelda Chick who guided the patient's management and agrees with plan.   Final Clinical Impressions(s) / ED Diagnoses   Final diagnoses:  Cyst of left ovary    ED Discharge Orders         Ordered    oxyCODONE-acetaminophen (PERCOCET/ROXICET) 5-325 MG tablet  Every 6 hours PRN  01/20/18 1600    ibuprofen (ADVIL,MOTRIN) 800 MG tablet  3 times daily     01/20/18 1600    ondansetron (ZOFRAN) 4 MG tablet  Every 6 hours     01/20/18 1626           LawWaylan Boga, Helmuth Recupero M, PA-C 01/20/18 2203    Doug SouJacubowitz, Sam, MD 01/21/18 [phone]

## 2018-01-20 NOTE — ED Notes (Signed)
Pt called out x2 for pain medication. EDP aware.

## 2018-01-20 NOTE — Discharge Instructions (Signed)
Contraception Choices  Contraception, also called birth control, refers to methods or devices that prevent pregnancy.  Hormonal methods  Contraceptive implant    A contraceptive implant is a thin, plastic tube that contains a hormone. It is inserted into the upper part of the arm. It can remain in place for up to 3 years.  Progestin-only injections  Progestin-only injections are injections of progestin, a synthetic form of the hormone progesterone. They are given every 3 months by a health care provider.  Birth control pills    Birth control pills are pills that contain hormones that prevent pregnancy. They must be taken once a day, preferably at the same time each day.  Birth control patch    The birth control patch contains hormones that prevent pregnancy. It is placed on the skin and must be changed once a week for three weeks and removed on the fourth week. A prescription is needed to use this method of contraception.  Vaginal ring    A vaginal ring contains hormones that prevent pregnancy. It is placed in the vagina for three weeks and removed on the fourth week. After that, the process is repeated with a new ring. A prescription is needed to use this method of contraception.  Emergency contraceptive  Emergency contraceptives prevent pregnancy after unprotected sex. They come in pill form and can be taken up to 5 days after sex. They work best the sooner they are taken after having sex. Most emergency contraceptives are available without a prescription. This method should not be used as your only form of birth control.  Barrier methods  Female condom    A female condom is a thin sheath that is worn over the penis during sex. Condoms keep sperm from going inside a woman's body. They can be used with a spermicide to increase their effectiveness. They should be disposed after a single use.  Female condom    A female condom is a soft, loose-fitting sheath that is put into the vagina before sex. The condom keeps sperm  from going inside a woman's body. They should be disposed after a single use.  Diaphragm    A diaphragm is a soft, dome-shaped barrier. It is inserted into the vagina before sex, along with a spermicide. The diaphragm blocks sperm from entering the uterus, and the spermicide kills sperm. A diaphragm should be left in the vagina for 6-8 hours after sex and removed within 24 hours.  A diaphragm is prescribed and fitted by a health care provider. A diaphragm should be replaced every 1-2 years, after giving birth, after gaining more than 15 lb (6.8 kg), and after pelvic surgery.  Cervical cap    A cervical cap is a round, soft latex or plastic cup that fits over the cervix. It is inserted into the vagina before sex, along with spermicide. It blocks sperm from entering the uterus. The cap should be left in place for 6-8 hours after sex and removed within 48 hours. A cervical cap must be prescribed and fitted by a health care provider. It should be replaced every 2 years.  Sponge    A sponge is a soft, circular piece of polyurethane foam with spermicide on it. The sponge helps block sperm from entering the uterus, and the spermicide kills sperm. To use it, you make it wet and then insert it into the vagina. It should be inserted before sex, left in for at least 6 hours after sex, and removed and thrown away within   30 hours.  Spermicides  Spermicides are chemicals that kill or block sperm from entering the cervix and uterus. They can come as a cream, jelly, suppository, foam, or tablet. A spermicide should be inserted into the vagina with an applicator at least 10-15 minutes before sex to allow time for it to work. The process must be repeated every time you have sex. Spermicides do not require a prescription.  Intrauterine contraception  Intrauterine device (IUD)  An IUD is a T-shaped device that is put in a woman's uterus. There are two types:   Hormone IUD.This type contains progestin, a synthetic form of the hormone  progesterone. This type can stay in place for 3-5 years.   Copper IUD.This type is wrapped in copper wire. It can stay in place for 10 years.    Permanent methods of contraception  Female tubal ligation  In this method, a woman's fallopian tubes are sealed, tied, or blocked during surgery to prevent eggs from traveling to the uterus.  Hysteroscopic sterilization  In this method, a small, flexible insert is placed into each fallopian tube. The inserts cause scar tissue to form in the fallopian tubes and block them, so sperm cannot reach an egg. The procedure takes about 3 months to be effective. Another form of birth control must be used during those 3 months.  Female sterilization  This is a procedure to tie off the tubes that carry sperm (vasectomy). After the procedure, the man can still ejaculate fluid (semen).  Natural planning methods  Natural family planning  In this method, a couple does not have sex on days when the woman could become pregnant.  Calendar method  This means keeping track of the length of each menstrual cycle, identifying the days when pregnancy can happen, and not having sex on those days.  Ovulation method  In this method, a couple avoids sex during ovulation.  Symptothermal method  This method involves not having sex during ovulation. The woman typically checks for ovulation by watching changes in her temperature and in the consistency of cervical mucus.  Post-ovulation method  In this method, a couple waits to have sex until after ovulation.  Summary   Contraception, also called birth control, means methods or devices that prevent pregnancy.   Hormonal methods of contraception include implants, injections, pills, patches, vaginal rings, and emergency contraceptives.   Barrier methods of contraception can include female condoms, female condoms, diaphragms, cervical caps, sponges, and spermicides.   There are two types of IUDs (intrauterine devices). An IUD can be put in a woman's uterus to  prevent pregnancy for 3-5 years.   Permanent sterilization can be done through a procedure for males, females, or both.   Natural family planning methods involve not having sex on days when the woman could become pregnant.  This information is not intended to replace advice given to you by your health care provider. Make sure you discuss any questions you have with your health care provider.  Document Released: 01/13/2005 Document Revised: 01/15/2017 Document Reviewed: 02/16/2016  [employer] Interactive Patient Education  2019 [employer]

## 2018-01-20 NOTE — MAU Provider Note (Signed)
History     CSN: [id number]  Arrival date and time: 01/20/18 2106   First Provider Initiated Contact with Patient 01/20/18 2119      Chief Complaint  Patient presents with  . Abdominal Pain   HPI ANALYCIA KHOKHAR is a [name] non pregnant female who presents with lower abdominal pain. She was seen at [facility] today and had a full evaluation including rule out torsion. She states no one is doing anything for her pain and she wanted to be seen again. She was discharged with percocet and ibuprofen but did not try them before coming in. She rates the pain a 2/10. She denies bleeding or discharge.   OB History    Gravida  2   Para  1   Term  1   Preterm      AB  1   Living  1     SAB      TAB  1   Ectopic      Multiple  0   Live Births  1           Past Medical History:  Diagnosis Date  . Eczema     Past Surgical History:  Procedure Laterality Date  . ovary removed      Family History  Problem Relation Age of Onset  . Aneurysm Mother   . Fibroids Mother     Social History   Tobacco Use  . Smoking status: Never Smoker  . Smokeless tobacco: Never Used  Substance Use Topics  . Alcohol use: No  . Drug use: No    Allergies: No Known Allergies  Facility-Administered Medications Prior to Admission  Medication Dose Route Frequency Provider Last Rate Last Dose  . Tdap (BOOSTRIX) injection 0.5 mL  0.5 mL Intramuscular Once Willodean Rosenthal, MD       Medications Prior to Admission  Medication Sig Dispense Refill Last Dose  . acetaminophen (TYLENOL) 500 MG tablet Take 500 mg by mouth every 6 (six) hours as needed.   Not Taking  . calcium carbonate (TUMS - DOSED IN MG ELEMENTAL CALCIUM) 500 MG chewable tablet Chew 1 tablet by mouth daily.   More than a month at Unknown time  . ibuprofen (ADVIL,MOTRIN) 800 MG tablet Take 1 tablet (800 mg total) by mouth 3 (three) times daily. 21 tablet 0 01/20/2018 at 1900  . metroNIDAZOLE (FLAGYL) 500 MG tablet  Take 1 tablet (500 mg total) by mouth 2 (two) times daily. (Patient not taking: Reported on 10/14/2017) 14 tablet 0 Not Taking  . norethindrone (ORTHO MICRONOR) 0.35 MG tablet Take 1 tablet (0.35 mg total) by mouth daily. (Patient not taking: Reported on 09/23/2017) 1 Package 11 Not Taking  . ondansetron (ZOFRAN) 4 MG tablet Take 1 tablet (4 mg total) by mouth every 6 (six) hours. 12 tablet 0 More than a month at Unknown time  . oxyCODONE-acetaminophen (PERCOCET/ROXICET) 5-325 MG tablet Take 1-2 tablets by mouth every 6 (six) hours as needed for severe pain. 15 tablet 0 01/20/2018 at 1930    Review of Systems  Constitutional: Negative.  Negative for fatigue and fever.  HENT: Negative.   Respiratory: Negative.  Negative for shortness of breath.   Cardiovascular: Negative.  Negative for chest pain.  Gastrointestinal: Positive for abdominal pain. Negative for constipation, diarrhea, nausea and vomiting.  Genitourinary: Negative.  Negative for dysuria, vaginal bleeding and vaginal discharge.  Neurological: Negative.  Negative for dizziness and headaches.   Physical Exam   Last  menstrual period 12/21/2017, not currently breastfeeding.  Physical Exam  Nursing note and vitals reviewed. Constitutional: She is oriented to person, place, and time. She appears well-developed and well-nourished. No distress.  HENT:  Head: Normocephalic.  Eyes: Pupils are equal, round, and reactive to light.  Cardiovascular: Normal rate, regular rhythm and normal heart sounds.  Respiratory: Effort normal and breath sounds normal. No respiratory distress.  GI: Soft. Bowel sounds are normal. She exhibits no distension. There is no abdominal tenderness. There is no rebound and no guarding.  Neurological: She is alert and oriented to person, place, and time.  Skin: Skin is warm and dry.  Psychiatric: She has a normal mood and affect. Her behavior is normal. Judgment and thought content normal.    MAU Course   Procedures Results for orders placed or performed during the hospital encounter of 01/20/18 (from the past 24 hour(s))  Comprehensive metabolic panel     Status: Abnormal   Collection Time: 01/20/18 12:49 PM  Result Value Ref Range   Sodium 134 (L) 135 - 145 mmol/L   Potassium 4.5 3.5 - 5.1 mmol/L   Chloride 108 98 - 111 mmol/L   CO2 17 (L) 22 - 32 mmol/L   Glucose, Bld 130 (H) 70 - 99 mg/dL   BUN 12 6 - 20 mg/dL   Creatinine, Ser 1.610.82 0.44 - 1.00 mg/dL   Calcium 8.7 (L) 8.9 - 10.3 mg/dL   Total Protein 7.0 6.5 - 8.1 g/dL   Albumin 4.5 3.5 - 5.0 g/dL   AST 67 (H) 15 - 41 U/L   ALT 19 0 - 44 U/L   Alkaline Phosphatase 62 38 - 126 U/L   Total Bilirubin 1.2 0.3 - 1.2 mg/dL   GFR calc non Af Amer >60 >60 mL/min   GFR calc Af Amer >60 >60 mL/min   Anion gap 9 5 - 15  CBC with Differential     Status: Abnormal   Collection Time: 01/20/18 12:49 PM  Result Value Ref Range   WBC 8.0 4.0 - 10.5 K/uL   RBC 4.44 3.87 - 5.11 MIL/uL   Hemoglobin 8.6 (L) 12.0 - 15.0 g/dL   HCT 09.631.3 (L) 04.536.0 - 40.946.0 %   MCV 70.5 (L) 80.0 - 100.0 fL   MCH 19.4 (L) 26.0 - 34.0 pg   MCHC 27.5 (L) 30.0 - 36.0 g/dL   RDW 81.121.4 (H) 91.411.5 - 78.215.5 %   Platelets 247 150 - 400 K/uL   nRBC 0.0 0.0 - 0.2 %   Neutrophils Relative % 61 %   Neutro Abs 4.9 1.7 - 7.7 K/uL   Lymphocytes Relative 32 %   Lymphs Abs 2.5 0.7 - 4.0 K/uL   Monocytes Relative 7 %   Monocytes Absolute 0.5 0.1 - 1.0 K/uL   Eosinophils Relative 0 %   Eosinophils Absolute 0.0 0.0 - 0.5 K/uL   Basophils Relative 0 %   Basophils Absolute 0.0 0.0 - 0.1 K/uL   RBC Morphology ELLIPTOCYTES.    Immature Granulocytes 0 %   Abs Immature Granulocytes 0.01 0.00 - 0.07 K/uL   Tear Drop Cells PRESENT    Polychromasia PRESENT   hCG, quantitative, pregnancy     Status: None   Collection Time: 01/20/18 12:50 PM  Result Value Ref Range   hCG, Beta Chain, Quant, S <1 <5 mIU/mL  Wet prep, genital     Status: Abnormal   Collection Time: 01/20/18  2:28 PM   Result Value Ref Range   Yeast  Wet Prep HPF POC NONE SEEN NONE SEEN   Trich, Wet Prep NONE SEEN NONE SEEN   Clue Cells Wet Prep HPF POC PRESENT (A) NONE SEEN   WBC, Wet Prep HPF POC MANY (A) NONE SEEN   Sperm NONE SEEN    [place] Transvaginal Non-ob  Result Date: 01/20/2018 CLINICAL DATA:  Sudden onset left adnexal pain. Previous right nephrectomy. IUD placed 3 months ago. EXAM: TRANSABDOMINAL AND TRANSVAGINAL ULTRASOUND OF PELVIS DOPPLER ULTRASOUND OF OVARIES TECHNIQUE: Both transabdominal and transvaginal ultrasound examinations of the pelvis were performed. Transabdominal technique was performed for global imaging of the pelvis including uterus, ovaries, adnexal regions, and pelvic cul-de-sac. It was necessary to proceed with endovaginal exam following the transabdominal exam to visualize the endometrium and ovaries. Color and duplex Doppler ultrasound was utilized to evaluate blood flow to the ovaries. COMPARISON:  09/17/2017 FINDINGS: Uterus Measurements: 4.7 x 5.9 x 8.6 cm = volume: 125 mL. No fibroids or other mass visualized. IUD is low position over the endometrial canal of the luteal lower uterine segment extending towards the cervical region. Endometrium Thickness: 6 mm.  No focal abnormality visualized. Right ovary Previous right oophorectomy.  No right adnexal abnormality. Left ovary Measurements: 3.9 x 6.1 x 7.0 cm = volume: 87 mL. Simple 4.8 cm cyst with no focal solid component or internal vascularity. Pulsed Doppler evaluation of the left ovary demonstrates normal low-resistance arterial and venous waveforms. Other findings Moderate free simple pelvic fluid. IMPRESSION: 4.8 cm simple left ovarian cyst. Normal left ovarian vascularity. Moderate amount of free simple appearing pelvic fluid. Normal uterus and endometrium. Previous right oophorectomy. Note that patient's IUD is low over the endometrial canal in the lower uterine segment extending towards the cervical region. Electronically Signed    By: Elberta Fortisaniel  Boyle M.D.   On: 01/20/2018 13:59   [place] Pelvis Complete  Result Date: 01/20/2018 CLINICAL DATA:  Sudden onset left adnexal pain. Previous right nephrectomy. IUD placed 3 months ago. EXAM: TRANSABDOMINAL AND TRANSVAGINAL ULTRASOUND OF PELVIS DOPPLER ULTRASOUND OF OVARIES TECHNIQUE: Both transabdominal and transvaginal ultrasound examinations of the pelvis were performed. Transabdominal technique was performed for global imaging of the pelvis including uterus, ovaries, adnexal regions, and pelvic cul-de-sac. It was necessary to proceed with endovaginal exam following the transabdominal exam to visualize the endometrium and ovaries. Color and duplex Doppler ultrasound was utilized to evaluate blood flow to the ovaries. COMPARISON:  09/17/2017 FINDINGS: Uterus Measurements: 4.7 x 5.9 x 8.6 cm = volume: 125 mL. No fibroids or other mass visualized. IUD is low position over the endometrial canal of the luteal lower uterine segment extending towards the cervical region. Endometrium Thickness: 6 mm.  No focal abnormality visualized. Right ovary Previous right oophorectomy.  No right adnexal abnormality. Left ovary Measurements: 3.9 x 6.1 x 7.0 cm = volume: 87 mL. Simple 4.8 cm cyst with no focal solid component or internal vascularity. Pulsed Doppler evaluation of the left ovary demonstrates normal low-resistance arterial and venous waveforms. Other findings Moderate free simple pelvic fluid. IMPRESSION: 4.8 cm simple left ovarian cyst. Normal left ovarian vascularity. Moderate amount of free simple appearing pelvic fluid. Normal uterus and endometrium. Previous right oophorectomy. Note that patient's IUD is low over the endometrial canal in the lower uterine segment extending towards the cervical region. Electronically Signed   By: Elberta Fortisaniel  Boyle M.D.   On: 01/20/2018 13:59   [place] Art/ven Flow Abd Pelv Doppler  Result Date: 01/20/2018 CLINICAL DATA:  Sudden onset left adnexal pain. Previous right  nephrectomy. IUD  placed 3 months ago. EXAM: TRANSABDOMINAL AND TRANSVAGINAL ULTRASOUND OF PELVIS DOPPLER ULTRASOUND OF OVARIES TECHNIQUE: Both transabdominal and transvaginal ultrasound examinations of the pelvis were performed. Transabdominal technique was performed for global imaging of the pelvis including uterus, ovaries, adnexal regions, and pelvic cul-de-sac. It was necessary to proceed with endovaginal exam following the transabdominal exam to visualize the endometrium and ovaries. Color and duplex Doppler ultrasound was utilized to evaluate blood flow to the ovaries. COMPARISON:  09/17/2017 FINDINGS: Uterus Measurements: 4.7 x 5.9 x 8.6 cm = volume: 125 mL. No fibroids or other mass visualized. IUD is low position over the endometrial canal of the luteal lower uterine segment extending towards the cervical region. Endometrium Thickness: 6 mm.  No focal abnormality visualized. Right ovary Previous right oophorectomy.  No right adnexal abnormality. Left ovary Measurements: 3.9 x 6.1 x 7.0 cm = volume: 87 mL. Simple 4.8 cm cyst with no focal solid component or internal vascularity. Pulsed Doppler evaluation of the left ovary demonstrates normal low-resistance arterial and venous waveforms. Other findings Moderate free simple pelvic fluid. IMPRESSION: 4.8 cm simple left ovarian cyst. Normal left ovarian vascularity. Moderate amount of free simple appearing pelvic fluid. Normal uterus and endometrium. Previous right oophorectomy. Note that patient's IUD is low over the endometrial canal in the lower uterine segment extending towards the cervical region. Electronically Signed   By: Elberta Fortis M.D.   On: 01/20/2018 13:59   MDM IUD malpositioned on ultrasound today. Patient reports the ED attempted to reposition it before leaving with no relief. Offered patient removal of IUD. Patient agreeable to plan.  IUD Removal  Patient identified, informed consent performed, consent signed.  Patient was in the dorsal  lithotomy position, normal external genitalia was noted.  A speculum was placed in the patient's vagina, normal discharge was noted, no lesions. The cervix was visualized, no lesions, no abnormal discharge.  The strings of the IUD were grasped and pulled using ring forceps. The IUD was removed in its entirety.  Patient tolerated the procedure well.    Assessment and Plan   1. Malpositioned intrauterine device (IUD), initial encounter    -Discharge home in stable condition -Encouraged patient to take previously prescribed medication -Abdominal pain precautions discussed -Patient advised to follow-up with [facility] for birth control management. -Patient may return to MAU as needed or if her condition were to change or worsen  Rolm Bookbinder CNM 01/20/2018, 9:20 PM

## 2018-01-20 NOTE — ED Triage Notes (Signed)
Pt reports left lower abd pain, Hx chronic ovarian cysts. Pt appears in severe pain, reports vomiting

## 2018-01-20 NOTE — Discharge Instructions (Addendum)
Take 1-2 Percocet every 6 hours as needed for severe pain.  Do not take Percocet again before 7 PM tonight.  Take ibuprofen every 8 hours as needed for your pain.  Do not take ibuprofen again before 10 PM tonight.  Use heating pad alternating 20 minutes on, 20 minutes off.  Please follow-up with your OB/GYN for the end of the week.  Please go to [facility] emergency department or your closest emergency department if you develop any new or worsening symptoms.  Do not drink alcohol, drive, operate machinery or participate in any other potentially dangerous activities while taking opiate pain medication as it may make you sleepy. Do not take this medication with any other sedating medications, either prescription or over-the-counter. If you were prescribed Percocet or Vicodin, do not take these with acetaminophen (Tylenol) as it is already contained within these medications and overdose of Tylenol is dangerous.   This medication is an opiate (or narcotic) pain medication and can be habit forming.  Use it as little as possible to achieve adequate pain control.  Do not use or use it with extreme caution if you have a history of opiate abuse or dependence. This medication is intended for your use only - do not give any to anyone else and keep it in a secure place where nobody else, especially children, have access to it. It will also cause or worsen constipation, so you may want to consider taking an over-the-counter stool softener while you are taking this medication.

## 2018-01-20 NOTE — MAU Note (Signed)
Pt here with c/o left abdominal pain. Hx of ovarian cysts and thinks the pain is from that.

## 2018-01-21 LAB — GC/CHLAMYDIA PROBE AMP (~~LOC~~) NOT AT ARMC
Chlamydia: NEGATIVE
Neisseria Gonorrhea: NEGATIVE

## 2018-01-22 ENCOUNTER — Ambulatory Visit (INDEPENDENT_AMBULATORY_CARE_PROVIDER_SITE_OTHER): Payer: Medicaid Other | Admitting: Family Medicine

## 2018-01-22 ENCOUNTER — Encounter: Payer: Self-pay | Admitting: Family Medicine

## 2018-01-22 VITALS — BP 105/71 | HR 95 | Ht 64.0 in | Wt 146.0 lb

## 2018-01-22 DIAGNOSIS — N83202 Unspecified ovarian cyst, left side: Secondary | ICD-10-CM

## 2018-01-22 DIAGNOSIS — T8332XA Displacement of intrauterine contraceptive device, initial encounter: Secondary | ICD-10-CM

## 2018-01-22 MED ORDER — NORETHINDRONE 0.35 MG PO TABS: 1.0000 | ORAL_TABLET | Freq: Every day | ORAL | 11 refills | Status: DC

## 2018-01-22 NOTE — Progress Notes (Signed)
   Subjective:    Patient ID: [name], female    DOB: 10/03/1995, 22 y.o.   MRN: [id number]  HPI Seen for follow up of pelvic pain and IUD removal in MAU. IUD in LUS and perhaps cervix. Also had 4.8cm simple ovarian cyst on left. Pain improving after removal. No discharge. Mild bleeding after removal.   Review of Systems     Objective:   Physical Exam Constitutional:      Appearance: Normal appearance.  Cardiovascular:     Rate and Rhythm: Normal rate and regular rhythm.     Pulses: Normal pulses.  Pulmonary:     Effort: Pulmonary effort is normal.     Breath sounds: Normal breath sounds.  Abdominal:     General: Abdomen is flat. There is no distension.     Palpations: Abdomen is soft. There is no mass.     Tenderness: There is no abdominal tenderness.     Hernia: No hernia is present.  Neurological:     Mental Status: She is alert.       Assessment & Plan:  1. Malpositioned intrauterine device (IUD), initial encounter Would like to be back on progesterone only pills. (family history of brain aneurysms and personal h/o migraines.  2. Cyst of left ovary Continue ibuprofen.

## 2018-06-23 ENCOUNTER — Other Ambulatory Visit (HOSPITAL_COMMUNITY)
Admission: RE | Admit: 2018-06-23 | Discharge: 2018-06-23 | Disposition: A | Discharge status: Home / Self Care | Payer: Medicaid Other | Source: Ambulatory Visit | Attending: Family Medicine | Admitting: Family Medicine

## 2018-06-23 ENCOUNTER — Ambulatory Visit: Payer: Medicaid Other

## 2018-06-23 ENCOUNTER — Other Ambulatory Visit: Payer: Self-pay

## 2018-06-23 VITALS — BP 111/72 | HR 79 | Ht 63.5 in | Wt 148.0 lb

## 2018-06-23 DIAGNOSIS — Z113 Encounter for screening for infections with a predominantly sexual mode of transmission: Secondary | ICD-10-CM | POA: Insufficient documentation

## 2018-06-23 DIAGNOSIS — N898 Other specified noninflammatory disorders of vagina: Secondary | ICD-10-CM

## 2018-06-23 NOTE — Progress Notes (Signed)
Patient states she has had a discharge for four days. Patient states it is a white discharge and she just feels uncomfortable. Patient is complaining of painful intercourse and feeling irritated after intercourse.  Patient states she does have a new partner- will test for GC/chl. Armandina Stammer RN

## 2018-06-24 NOTE — Progress Notes (Signed)
Chart reviewed - agree with RN documentation.   

## 2018-06-28 ENCOUNTER — Telehealth: Payer: Self-pay

## 2018-06-28 NOTE — Telephone Encounter (Signed)
Patient made aware that her ancillary testing for BV and yeast has not resulted yet. Armandina Stammer RN

## 2018-06-30 LAB — CERVICOVAGINAL ANCILLARY ONLY
Bacterial vaginitis: POSITIVE — AB
Candida vaginitis: NEGATIVE
Chlamydia: NEGATIVE
Neisseria Gonorrhea: NEGATIVE
Trichomonas: NEGATIVE

## 2018-06-30 MED ORDER — FLUCONAZOLE 150 MG PO TABS: 150.0000 mg | ORAL_TABLET | Freq: Once | ORAL | 1 refills | Status: AC

## 2018-07-01 ENCOUNTER — Telehealth: Payer: Self-pay

## 2018-07-01 DIAGNOSIS — B9689 Other specified bacterial agents as the cause of diseases classified elsewhere: Secondary | ICD-10-CM

## 2018-07-01 DIAGNOSIS — N760 Acute vaginitis: Secondary | ICD-10-CM

## 2018-07-01 MED ORDER — METRONIDAZOLE 500 MG PO TABS: 500.0000 mg | ORAL_TABLET | Freq: Two times a day (BID) | ORAL | 0 refills | Status: DC

## 2018-07-01 NOTE — Telephone Encounter (Signed)
Called pt to inform her of positive BV results. Medication was sent to pharmacy. Understanding was voiced.  Tahjae Durr l Inetha Maret, CMA

## 2018-08-11 ENCOUNTER — Telehealth: Payer: Self-pay

## 2018-08-11 NOTE — Telephone Encounter (Signed)
Patient states she has had a dull lower abdominal pain that has increased in nature over the last five days. Patient states she has had ovarian cysts in the past and it feels like that type of pain. (patient had ovarian torsion 12-2017). Patient advised to go to hospital now since we have no available appointments but patient doesn't not was to go due to covid patient restrictions of no visitors. Patient given appointment tomorrow 08/12/2018 with Dr. Ihor Dow.  Patient is taking 800 mg of ibprofen every six hours with little relief. Suggested heating pad or warm bath and told to proceed to hospital if pain worsens and not controlled. Patient states understanding. Kathrene Alu RN

## 2018-08-12 ENCOUNTER — Ambulatory Visit: Payer: Medicaid Other | Admitting: Obstetrics & Gynecology

## 2018-11-03 ENCOUNTER — Ambulatory Visit (INDEPENDENT_AMBULATORY_CARE_PROVIDER_SITE_OTHER): Payer: Medicaid Other | Admitting: Obstetrics & Gynecology

## 2018-11-03 ENCOUNTER — Other Ambulatory Visit: Payer: Self-pay

## 2018-11-03 ENCOUNTER — Other Ambulatory Visit (HOSPITAL_COMMUNITY)
Admission: RE | Admit: 2018-11-03 | Discharge: 2018-11-03 | Disposition: A | Discharge status: Home / Self Care | Payer: Medicaid Other | Source: Ambulatory Visit | Attending: Obstetrics & Gynecology | Admitting: Obstetrics & Gynecology

## 2018-11-03 VITALS — BP 103/70 | HR 78 | Ht 64.0 in | Wt 147.0 lb

## 2018-11-03 DIAGNOSIS — N898 Other specified noninflammatory disorders of vagina: Secondary | ICD-10-CM | POA: Diagnosis present

## 2018-11-03 NOTE — Progress Notes (Signed)
Pt states that she has been having discharge with odor x 3 days. Wet prep was sent to the lab.  chiquita l wilson, CMA   Attestation of Attending Supervision of RN: Evaluation and management procedures were performed by the nurse under my supervision and collaboration.  I have reviewed the nursing note and chart, and I agree with the management and plan.  Carolyn L. Harraway-Smith, M.D., Cherlynn June

## 2018-11-10 LAB — CERVICOVAGINAL ANCILLARY ONLY
Bacterial Vaginitis (gardnerella): POSITIVE — AB
Candida Glabrata: NEGATIVE
Candida Vaginitis: NEGATIVE
Comment: NEGATIVE
Comment: NEGATIVE
Comment: NEGATIVE

## 2018-11-12 ENCOUNTER — Telehealth: Payer: Self-pay

## 2018-11-12 DIAGNOSIS — B9689 Other specified bacterial agents as the cause of diseases classified elsewhere: Secondary | ICD-10-CM

## 2018-11-12 DIAGNOSIS — N760 Acute vaginitis: Secondary | ICD-10-CM

## 2018-11-12 MED ORDER — METRONIDAZOLE 500 MG PO TABS: 500.0000 mg | ORAL_TABLET | Freq: Two times a day (BID) | ORAL | 0 refills | Status: DC

## 2018-11-12 NOTE — Telephone Encounter (Signed)
Pt called the office requesting results. Pt made aware that she has BV and medication was sent to pharmacy. Understanding was voiced.  [name] l [name], CMA

## 2018-11-12 NOTE — Telephone Encounter (Signed)
Called pt regarding positive BV  results. Left message for pt to call the office back. Flagyl was sent to the pharmacy. chiquita l wilson, CMA 

## 2019-01-29 DIAGNOSIS — F341 Dysthymic disorder: Secondary | ICD-10-CM | POA: Diagnosis not present

## 2019-02-10 DIAGNOSIS — F341 Dysthymic disorder: Secondary | ICD-10-CM | POA: Diagnosis not present

## 2019-02-22 ENCOUNTER — Ambulatory Visit: Payer: Medicaid Other | Admitting: Advanced Practice Midwife

## 2019-02-23 DIAGNOSIS — F341 Dysthymic disorder: Secondary | ICD-10-CM | POA: Diagnosis not present

## 2019-03-03 DIAGNOSIS — F341 Dysthymic disorder: Secondary | ICD-10-CM | POA: Diagnosis not present

## 2019-03-17 DIAGNOSIS — F341 Dysthymic disorder: Secondary | ICD-10-CM | POA: Diagnosis not present

## 2019-03-26 DIAGNOSIS — F341 Dysthymic disorder: Secondary | ICD-10-CM | POA: Diagnosis not present

## 2019-04-03 DIAGNOSIS — F341 Dysthymic disorder: Secondary | ICD-10-CM | POA: Diagnosis not present

## 2019-04-07 ENCOUNTER — Ambulatory Visit: Payer: Medicaid Other | Admitting: Obstetrics & Gynecology

## 2019-04-13 ENCOUNTER — Encounter (HOSPITAL_BASED_OUTPATIENT_CLINIC_OR_DEPARTMENT_OTHER): Payer: Self-pay

## 2019-04-13 ENCOUNTER — Emergency Department (HOSPITAL_BASED_OUTPATIENT_CLINIC_OR_DEPARTMENT_OTHER): Payer: Medicaid Other

## 2019-04-13 ENCOUNTER — Emergency Department (HOSPITAL_BASED_OUTPATIENT_CLINIC_OR_DEPARTMENT_OTHER)
Admission: EM | Admit: 2019-04-13 | Discharge: 2019-04-14 | Disposition: A | Discharge status: Home / Self Care | Payer: Medicaid Other | Attending: Emergency Medicine | Admitting: Emergency Medicine

## 2019-04-13 ENCOUNTER — Other Ambulatory Visit: Payer: Self-pay

## 2019-04-13 DIAGNOSIS — R1032 Left lower quadrant pain: Secondary | ICD-10-CM

## 2019-04-13 DIAGNOSIS — R102 Pelvic and perineal pain: Secondary | ICD-10-CM | POA: Diagnosis not present

## 2019-04-13 LAB — CBC WITH DIFFERENTIAL/PLATELET
Abs Immature Granulocytes: 0.03 10*3/uL (ref 0.00–0.07)
Basophils Absolute: 0 10*3/uL (ref 0.0–0.1)
Basophils Relative: 0 %
Eosinophils Absolute: 0.1 10*3/uL (ref 0.0–0.5)
Eosinophils Relative: 2 %
HCT: 37.8 % (ref 36.0–46.0)
Hemoglobin: 11.3 g/dL — ABNORMAL LOW (ref 12.0–15.0)
Immature Granulocytes: 0 %
Lymphocytes Relative: 33 %
Lymphs Abs: 2.7 10*3/uL (ref 0.7–4.0)
MCH: 23.7 pg — ABNORMAL LOW (ref 26.0–34.0)
MCHC: 29.9 g/dL — ABNORMAL LOW (ref 30.0–36.0)
MCV: 79.4 fL — ABNORMAL LOW (ref 80.0–100.0)
Monocytes Absolute: 0.6 10*3/uL (ref 0.1–1.0)
Monocytes Relative: 7 %
Neutro Abs: 4.7 10*3/uL (ref 1.7–7.7)
Neutrophils Relative %: 58 %
Platelets: 286 10*3/uL (ref 150–400)
RBC: 4.76 MIL/uL (ref 3.87–5.11)
RDW: 20.7 % — ABNORMAL HIGH (ref 11.5–15.5)
WBC: 8.1 10*3/uL (ref 4.0–10.5)
nRBC: 0 % (ref 0.0–0.2)

## 2019-04-13 LAB — URINALYSIS, MICROSCOPIC (REFLEX)

## 2019-04-13 LAB — COMPREHENSIVE METABOLIC PANEL
ALT: 13 U/L (ref 0–44)
AST: 28 U/L (ref 15–41)
Albumin: 4.6 g/dL (ref 3.5–5.0)
Alkaline Phosphatase: 69 U/L (ref 38–126)
Anion gap: 8 (ref 5–15)
BUN: 10 mg/dL (ref 6–20)
CO2: 23 mmol/L (ref 22–32)
Calcium: 9.1 mg/dL (ref 8.9–10.3)
Chloride: 107 mmol/L (ref 98–111)
Creatinine, Ser: 0.87 mg/dL (ref 0.44–1.00)
GFR calc Af Amer: >60 mL/min (ref >60)
GFR calc non Af Amer: >60 mL/min (ref >60)
Glucose, Bld: 80 mg/dL (ref 70–99)
Potassium: 3.7 mmol/L (ref 3.5–5.1)
Sodium: 138 mmol/L (ref 135–145)
Total Bilirubin: 0.4 mg/dL (ref 0.3–1.2)
Total Protein: 7.3 g/dL (ref 6.5–8.1)

## 2019-04-13 LAB — URINALYSIS, ROUTINE W REFLEX MICROSCOPIC
Bilirubin Urine: NEGATIVE
Glucose, UA: NEGATIVE mg/dL
Ketones, ur: NEGATIVE mg/dL
Nitrite: NEGATIVE
Protein, ur: NEGATIVE mg/dL
Specific Gravity, Urine: 1.01 (ref 1.005–1.030)
pH: 6 (ref 5.0–8.0)

## 2019-04-13 LAB — LIPASE, BLOOD: Lipase: 34 U/L (ref 11–51)

## 2019-04-13 LAB — PREGNANCY, URINE: Preg Test, Ur: NEGATIVE

## 2019-04-13 MED ORDER — KETOROLAC TROMETHAMINE 30 MG/ML IJ SOLN
30.0000 mg | Freq: Once | INTRAMUSCULAR | Status: AC
  Administered 2019-04-13: 30 mg via INTRAVENOUS
  Filled 2019-04-13: qty 1

## 2019-04-13 NOTE — ED Triage Notes (Signed)
Hx of ovarian cysts, x2 weeks abd pain similar to past ruptured cyst. States it is only painful when standing or walking. Only has 1 ovary and concerned for damage. Ibuprofen with no relief.

## 2019-04-13 NOTE — ED Provider Notes (Signed)
Emergency Department Provider Note   I have reviewed the triage vital signs and the nursing notes.   HISTORY  Chief Complaint Abdominal Pain   HPI [name] is a 24 y.o. female with PMH of eczema and prior ovarian torsion on the right presents to the ED with LLQ and flank pain.  She notes history of prior cysts in the past which feels similar.  She has had discomfort intermittently over the past 2 weeks that are typical of her ovarian cyst but today symptoms are suddenly worsening.  She is having pain especially with standing and walking.  The right ovary was removed at the age of 83 after she had ovarian torsion and there.  She denies any vaginal bleeding or discharge.  No concern for sexually transmitted infection.  Denies dysuria, hesitancy, urgency.  No fevers or chills.  No pain radiating up into the chest or shortness of breath.   Past Medical History:  Diagnosis Date  . Eczema     There are no problems to display for this patient.   Past Surgical History:  Procedure Laterality Date  . ovary removed      Allergies Patient has no known allergies.  Family History  Problem Relation Age of Onset  . Aneurysm Mother   . Fibroids Mother     Social History Social History   Tobacco Use  . Smoking status: Never Smoker  . Smokeless tobacco: Never Used  Substance Use Topics  . Alcohol use: Yes    Comment: occ  . Drug use: No    Review of Systems  Constitutional: No fever/chills Cardiovascular: Denies chest pain. Respiratory: Denies shortness of breath. Gastrointestinal: Positive LLQ abdominal pain.  No nausea, no vomiting.  No diarrhea.  No constipation. Genitourinary: Negative for dysuria. Musculoskeletal: Negative for back pain. Skin: Negative for rash. Neurological: Negative for headaches, focal weakness or numbness.  10-point ROS otherwise negative.  ____________________________________________   PHYSICAL EXAM:  VITAL SIGNS: ED Triage Vitals  [04/13/19 2156]  Enc Vitals Group     BP 122/80     Pulse Rate 82     Resp 18     Temp 97.9 F (36.6 C)     Temp Source Oral     SpO2 100 %     Weight 160 lb (72.6 kg)     Height 5\' 4"  (1.626 m)   Constitutional: Alert and oriented. Well appearing and in no acute distress. Eyes: Conjunctivae are normal.  Head: Atraumatic. Nose: No congestion/rhinnorhea. Mouth/Throat: Mucous membranes are moist.   Neck: No stridor.   Cardiovascular: Good peripheral circulation.  Respiratory: Normal respiratory effort.   Gastrointestinal: Soft and nontender. No distention.  Musculoskeletal: No gross deformities of extremities. Neurologic:  Normal speech and language. Skin:  Skin is warm, dry and intact. No rash noted.  ____________________________________________   LABS (all labs ordered are listed, but only abnormal results are displayed)  Labs Reviewed  CBC WITH DIFFERENTIAL/PLATELET - Abnormal; Notable for the following components:      Result Value   Hemoglobin 11.3 (*)    MCV 79.4 (*)    MCH 23.7 (*)    MCHC 29.9 (*)    RDW 20.7 (*)    All other components within normal limits  URINALYSIS, ROUTINE W REFLEX MICROSCOPIC - Abnormal; Notable for the following components:   Hgb urine dipstick LARGE (*)    Leukocytes,Ua TRACE (*)    All other components within normal limits  URINALYSIS, MICROSCOPIC (REFLEX) - Abnormal;  Notable for the following components:   Bacteria, UA FEW (*)    All other components within normal limits  COMPREHENSIVE METABOLIC PANEL  LIPASE, BLOOD  PREGNANCY, URINE   ____________________________________________  RADIOLOGY  US Transvaginal Non-OB  Result Date: 04/13/2019 CLINICAL DATA:  24 year old female with history of prior right oophorectomy as well as prior history of ovarian torsion presenting with intense left pelvic pain. EXAM: TRANSABDOMINAL AND TRANSVAGINAL ULTRASOUND OF PELVIS DOPPLER ULTRASOUND OF OVARIES TECHNIQUE: Both transabdominal and  transvaginal ultrasound examinations of the pelvis were performed. Transabdominal technique was performed for global imaging of the pelvis including uterus, ovaries, adnexal regions, and pelvic cul-de-sac. It was necessary to proceed with endovaginal exam following the transabdominal exam to visualize the endometrium and ovaries. Color and duplex Doppler ultrasound was utilized to evaluate blood flow to the ovaries. COMPARISON:  Pelvic ultrasound dated 01/20/2018. FINDINGS: Uterus Measurements: 9.3 x 4.7 x 4.2 cm = volume: 95 mL. The uterus is anteverted. There is slight heterogeneity of the myometrium with findings of possible mild adenomyosis. Endometrium Thickness: 12 mm. The endometrium is thickened and contains mobile echogenic content likely blood products/clot. Right ovary Oophorectomy. Left ovary Measurements: 6.0 x 3.2 x 5.8 cm = volume: 58 mL. The left ovary is enlarged with increased echogenicity and peripherally displaced follicles. There is a 2.5 x 2.5 x 2.5 cm hemorrhagic cyst in the left ovary. There is almost 360 degree twisting of the pedicle of the left ovary highly concerning for a degree of torsion or intermittent torsion. Pulsed Doppler evaluation of both ovaries demonstrates normal low-resistance arterial and venous waveforms. Other findings There is small amount of simple appearing fluid within the pelvis. IMPRESSION: 1. Enlarged left ovary with sonographic findings concerning for torsion. Clinical correlation and gynecology consult is advised. Doppler images demonstrate some preserved arterial and venous flow within the ovary. 2. Probable blood clot within the endometrium. These results were called by telephone at the time of interpretation on 04/13/2019 at 11:43 pm to provider Laquanna Veazey , who verbally acknowledged these results. Electronically Signed   By: Elgie Collard M.D.   On: 04/13/2019 23:57   US Pelvis Complete  Result Date: 04/13/2019 CLINICAL DATA:  24 year old female with  history of prior right oophorectomy as well as prior history of ovarian torsion presenting with intense left pelvic pain. EXAM: TRANSABDOMINAL AND TRANSVAGINAL ULTRASOUND OF PELVIS DOPPLER ULTRASOUND OF OVARIES TECHNIQUE: Both transabdominal and transvaginal ultrasound examinations of the pelvis were performed. Transabdominal technique was performed for global imaging of the pelvis including uterus, ovaries, adnexal regions, and pelvic cul-de-sac. It was necessary to proceed with endovaginal exam following the transabdominal exam to visualize the endometrium and ovaries. Color and duplex Doppler ultrasound was utilized to evaluate blood flow to the ovaries. COMPARISON:  Pelvic ultrasound dated 01/20/2018. FINDINGS: Uterus Measurements: 9.3 x 4.7 x 4.2 cm = volume: 95 mL. The uterus is anteverted. There is slight heterogeneity of the myometrium with findings of possible mild adenomyosis. Endometrium Thickness: 12 mm. The endometrium is thickened and contains mobile echogenic content likely blood products/clot. Right ovary Oophorectomy. Left ovary Measurements: 6.0 x 3.2 x 5.8 cm = volume: 58 mL. The left ovary is enlarged with increased echogenicity and peripherally displaced follicles. There is a 2.5 x 2.5 x 2.5 cm hemorrhagic cyst in the left ovary. There is almost 360 degree twisting of the pedicle of the left ovary highly concerning for a degree of torsion or intermittent torsion. Pulsed Doppler evaluation of both ovaries demonstrates normal low-resistance arterial and  venous waveforms. Other findings There is small amount of simple appearing fluid within the pelvis. IMPRESSION: 1. Enlarged left ovary with sonographic findings concerning for torsion. Clinical correlation and gynecology consult is advised. Doppler images demonstrate some preserved arterial and venous flow within the ovary. 2. Probable blood clot within the endometrium. These results were called by telephone at the time of interpretation on  04/13/2019 at 11:43 pm to provider Anelia Carriveau , who verbally acknowledged these results. Electronically Signed   By: Elgie Collard M.D.   On: 04/13/2019 23:57   [place] Art/Ven Flow Abd Pelv Doppler  Result Date: 04/13/2019 CLINICAL DATA:  24 year old female with history of prior right oophorectomy as well as prior history of ovarian torsion presenting with intense left pelvic pain. EXAM: TRANSABDOMINAL AND TRANSVAGINAL ULTRASOUND OF PELVIS DOPPLER ULTRASOUND OF OVARIES TECHNIQUE: Both transabdominal and transvaginal ultrasound examinations of the pelvis were performed. Transabdominal technique was performed for global imaging of the pelvis including uterus, ovaries, adnexal regions, and pelvic cul-de-sac. It was necessary to proceed with endovaginal exam following the transabdominal exam to visualize the endometrium and ovaries. Color and duplex Doppler ultrasound was utilized to evaluate blood flow to the ovaries. COMPARISON:  Pelvic ultrasound dated 01/20/2018. FINDINGS: Uterus Measurements: 9.3 x 4.7 x 4.2 cm = volume: 95 mL. The uterus is anteverted. There is slight heterogeneity of the myometrium with findings of possible mild adenomyosis. Endometrium Thickness: 12 mm. The endometrium is thickened and contains mobile echogenic content likely blood products/clot. Right ovary Oophorectomy. Left ovary Measurements: 6.0 x 3.2 x 5.8 cm = volume: 58 mL. The left ovary is enlarged with increased echogenicity and peripherally displaced follicles. There is a 2.5 x 2.5 x 2.5 cm hemorrhagic cyst in the left ovary. There is almost 360 degree twisting of the pedicle of the left ovary highly concerning for a degree of torsion or intermittent torsion. Pulsed Doppler evaluation of both ovaries demonstrates normal low-resistance arterial and venous waveforms. Other findings There is small amount of simple appearing fluid within the pelvis. IMPRESSION: 1. Enlarged left ovary with sonographic findings concerning for torsion.  Clinical correlation and gynecology consult is advised. Doppler images demonstrate some preserved arterial and venous flow within the ovary. 2. Probable blood clot within the endometrium. These results were called by telephone at the time of interpretation on 04/13/2019 at 11:43 pm to provider Milo Schreier , who verbally acknowledged these results. Electronically Signed   By: Elgie Collard M.D.   On: 04/13/2019 23:57    ____________________________________________   PROCEDURES  Procedure(s) performed:   Procedures  CRITICAL CARE Performed by: Maia Plan Total critical care time: 35 minutes Critical care time was exclusive of separately billable procedures and treating other patients. Critical care was necessary to treat or prevent imminent or life-threatening deterioration. Critical care was time spent personally by me on the following activities: development of treatment plan with patient and/or surrogate as well as nursing, discussions with consultants, evaluation of patient's response to treatment, examination of patient, obtaining history from patient or surrogate, ordering and performing treatments and interventions, ordering and review of laboratory studies, ordering and review of radiographic studies, pulse oximetry and re-evaluation of patient's condition.  Alona Bene, MD Emergency Medicine  ____________________________________________   INITIAL IMPRESSION / ASSESSMENT AND PLAN / ED COURSE  Pertinent labs & imaging results that were available during my care of the patient were reviewed by me and considered in my medical decision making (see chart for details).   Patient presents emergency department with  left lower quadrant abdominal pain similar to ovarian cysts in the past. Prior history of torsion. Plan for TVUS here and reassess.   12:01 AM  Spoke with Dr. Elonda Husky with OB/Gyn.  He has reviewed the ultrasound images and sees good flow to the ovaries with no venous  congestion.  This does not require emergent surgical evaluation and treatment.  Patient does continue to have significant pain on my reevaluation.  Spoke with GYN and they agreed to accept the patient to the MAU for in-person evaluation and further pain control.   12:10 AM Spoke with Dr. Leonette Monarch who accepts the patient in ED-ED transfer to see OB.  ____________________________________________  FINAL CLINICAL IMPRESSION(S) / ED DIAGNOSES  Final diagnoses:  Left lower quadrant abdominal pain    MEDICATIONS GIVEN DURING THIS VISIT:  Medications  ketorolac (TORADOL) 30 MG/ML injection 30 mg (30 mg Intravenous Given 04/13/19 2251)    Note:  This document was prepared using Dragon voice recognition software and may include unintentional dictation errors.  Nanda Quinton, MD, [facility] Emergency Medicine    Takela Varden, W[facility], MD 04/14/19 [phone]

## 2019-04-14 ENCOUNTER — Other Ambulatory Visit: Payer: Self-pay | Admitting: Obstetrics & Gynecology

## 2019-04-14 MED ORDER — FENTANYL CITRATE (PF) 100 MCG/2ML IJ SOLN: 100.0000 ug | Freq: Once | INTRAMUSCULAR | Status: DC

## 2019-04-14 MED ORDER — OXYCODONE-ACETAMINOPHEN 5-325 MG PO TABS: 1.0000 | ORAL_TABLET | ORAL | 0 refills | Status: DC | PRN

## 2019-04-14 MED ORDER — FENTANYL CITRATE (PF) 100 MCG/2ML IJ SOLN
50.0000 ug | Freq: Once | INTRAMUSCULAR | Status: AC
  Administered 2019-04-14: 50 ug via INTRAVENOUS
  Filled 2019-04-14: qty 2

## 2019-04-14 MED ORDER — KETOROLAC TROMETHAMINE 10 MG PO TABS: 10.0000 mg | ORAL_TABLET | Freq: Three times a day (TID) | ORAL | 0 refills | Status: DC | PRN

## 2019-04-14 NOTE — Progress Notes (Signed)
[name] 24 y.o. [phone] with transfer for concern of ovarian torsion on sonogram Pt has had intermittent LLQ pain for about 2 weeks, has not been seen prior This is my read of the sonogram: Sonogram does raise issue on Doppler images of a 1/4 turn of the proximal ovarian vasculature or the uterine side of the blood supply However there is no disruption of the venous flow or any evidence of venous congestion of the surrounding tissue or ovary itself.  Of course the arterial supply is undisturbed. She has had episodic issues with pain on this side in the past, notes reviewed.  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    Here in the ED she is sitting up relatively comfortable and in no way "looks" clinically like a torsion and she is completely uninterested in exploratory laparoscopy for eval given this clinical picture and I am not stating that surgery is necessarily required although would be necessary for definitive diagnosis as that is the only modality for definitive diagnosis.  She does not want surgery at this time.    She does want follow up with [place] in the office and that will be arranged for 2 weeks earlier if needed  I am also giving her some oral pain meds for management

## 2019-04-14 NOTE — ED Notes (Signed)
GYN at bedside

## 2019-04-14 NOTE — ED Provider Notes (Signed)
I assumed care of this patient from Dr. Jacqulyn Bath.  Please see their note for further details of Hx, PE.  Briefly patient is a 24 y.o. female who presented pelvic pain. Prior h/o of torsion. [place] concerning for torsion, but still has flow. Case discussed with Dr. Wallace Keller, [facility], who reviewed [place] and was not convinced for torsion, but will see in ED here at [facility].      Nira Conn, MD 04/14/19 [phone]

## 2019-04-14 NOTE — ED Provider Notes (Signed)
  1:33 AM Patient arrived from [facility] for evaluation of possible torsion. See prior notes for full H&P. States she is somewhat uncomfortable but tolerable.  She was given pain medication just prior to transfer.  GYN, Dr. Despina Hidden was notified-- requested rapid COVID for possible emergency surgery.  He will evaluate patient.  2:01 AM Dr. Despina Hidden evaluated patient in the ED, does not feel this represents acute torsion.  See his separate note for full details. Patient does not want further testing/surgical intervention such as ex-lap at this time, is willing to follow-up in clinic as OP.  Dr. Despina Hidden sent pain meds to pharmacy, strict return precautions advised for any new/acute changes.   Garlon Hatchet, PA-C 04/14/19 0250    Nira Conn, MD 04/15/19 [phone]

## 2019-04-14 NOTE — Discharge Instructions (Signed)
We do recommend that you follow-up with OB-GYN within 2 weeks--- call for appt. You need to be seen sooner for any acute worsening of symptoms or change in pain. Take medications as prescribed.  Do not drive while taking pain medication. Return here for any new/acute changes.

## 2019-04-18 ENCOUNTER — Telehealth: Payer: Self-pay

## 2019-04-18 ENCOUNTER — Emergency Department (HOSPITAL_COMMUNITY)
Admission: EM | Admit: 2019-04-18 | Discharge: 2019-04-18 | Disposition: A | Discharge status: Home / Self Care | Payer: Medicaid Other | Attending: Emergency Medicine | Admitting: Emergency Medicine

## 2019-04-18 ENCOUNTER — Encounter (HOSPITAL_COMMUNITY): Payer: Self-pay | Admitting: Emergency Medicine

## 2019-04-18 DIAGNOSIS — N83209 Unspecified ovarian cyst, unspecified side: Secondary | ICD-10-CM | POA: Insufficient documentation

## 2019-04-18 DIAGNOSIS — Z5321 Procedure and treatment not carried out due to patient leaving prior to being seen by health care provider: Secondary | ICD-10-CM | POA: Insufficient documentation

## 2019-04-18 NOTE — Telephone Encounter (Signed)
Patient called stating that she would like an appointment because her pelvic pain has gotten worse. Patient states that she was seen in the hospital recently (March 17) and she just checked her results online that says she has an ovarian tosion and cyst. Patient states that she was transferred from the emergency room to [facility] hospital and who she saw didn't make it out to be that serious. Patient states that her pain has worsened and she is taking the pain medication he prescribed for her. Made her aware I do not advised she wait to be seen by our office and that she return to [facility] for evaluation. Patient states she doesn't want to see the doctor she saw while in the hospital - I made her aware that he is not on call today. Patient states understanding and agrees to seek care at the hospital for her pain. Armandina Stammer RN

## 2019-04-18 NOTE — ED Triage Notes (Signed)
Pt. Stated, Dr. Carleene Mains told me to come here for surgery cause of cyst on ovary and its turned 360 degrees around my ovary.[name] Dr. Debroah Loop will do the surgery will do the surgery just come here.

## 2019-04-19 DIAGNOSIS — F341 Dysthymic disorder: Secondary | ICD-10-CM | POA: Diagnosis not present

## 2019-04-20 ENCOUNTER — Other Ambulatory Visit: Payer: Self-pay

## 2019-04-20 ENCOUNTER — Other Ambulatory Visit: Payer: Self-pay | Admitting: Obstetrics & Gynecology

## 2019-04-20 ENCOUNTER — Ambulatory Visit (HOSPITAL_BASED_OUTPATIENT_CLINIC_OR_DEPARTMENT_OTHER)
Admission: RE | Admit: 2019-04-20 | Discharge: 2019-04-20 | Disposition: A | Discharge status: Home / Self Care | Payer: Medicaid Other | Source: Ambulatory Visit | Attending: Obstetrics & Gynecology | Admitting: Obstetrics & Gynecology

## 2019-04-20 ENCOUNTER — Ambulatory Visit (INDEPENDENT_AMBULATORY_CARE_PROVIDER_SITE_OTHER): Payer: Medicaid Other | Admitting: Obstetrics & Gynecology

## 2019-04-20 ENCOUNTER — Encounter: Payer: Self-pay | Admitting: Obstetrics & Gynecology

## 2019-04-20 VITALS — BP 104/68 | HR 83 | Ht 64.0 in | Wt 151.1 lb

## 2019-04-20 DIAGNOSIS — N83202 Unspecified ovarian cyst, left side: Secondary | ICD-10-CM

## 2019-04-20 DIAGNOSIS — R1084 Generalized abdominal pain: Secondary | ICD-10-CM

## 2019-04-20 DIAGNOSIS — N83519 Torsion of ovary and ovarian pedicle, unspecified side: Secondary | ICD-10-CM

## 2019-04-20 DIAGNOSIS — N83512 Torsion of left ovary and ovarian pedicle: Secondary | ICD-10-CM

## 2019-04-20 DIAGNOSIS — N83292 Other ovarian cyst, left side: Secondary | ICD-10-CM | POA: Diagnosis not present

## 2019-04-20 DIAGNOSIS — N83209 Unspecified ovarian cyst, unspecified side: Secondary | ICD-10-CM

## 2019-04-20 NOTE — Progress Notes (Signed)
History:  24 y.o. [id number] here today for eval of pelvic pain. Pt reports that she was seen in the ED 3 times. When she was seen she was told that she had a ovarian torsion but, she was told by the GYN doc that it did not appear to be a torsion. She went to the ED on yesterday and did not get seen. She reports that the pain today is almost gone and she is worried about lost of th ovary.         The following portions of the patient's history were reviewed and updated as appropriate: allergies, current medications, past family history, past medical history, past social history, past surgical history and problem list.  Review of Systems:  Pertinent items are noted in HPI.    Objective:  Physical Exam Blood pressure 104/68, pulse 83, height 5\' 4"  (1.626 m), weight 151 lb 1.3 oz (68.5 kg), last menstrual period 04/13/2019.  CONSTITUTIONAL: Well-developed, well-nourished female in no acute distress.  HENT:  Normocephalic, atraumatic EYES: Conjunctivae and EOM are normal. No scleral icterus.  NECK: Normal range of motion SKIN: Skin is warm and dry. No rash noted. Not diaphoretic.No pallor. NEUROLGIC: Alert and oriented to person, place, and time. Normal coordination.  Abd: Soft, nontender and nondistended Pelvic: Normal appearing external genitalia; normal appearing vaginal mucosa and cervix.  Normal discharge.  Small uterus, no other palpable masses, no uterine or adnexal tenderness  Labs and Imaging 04/15/2019 Transvaginal Non-OB  Result Date: 04/13/2019 CLINICAL DATA:  24 year old female with history of prior right oophorectomy as well as prior history of ovarian torsion presenting with intense left pelvic pain. EXAM: TRANSABDOMINAL AND TRANSVAGINAL ULTRASOUND OF PELVIS DOPPLER ULTRASOUND OF OVARIES TECHNIQUE: Both transabdominal and transvaginal ultrasound examinations of the pelvis were performed. Transabdominal technique was performed for global imaging of the pelvis including uterus, ovaries, adnexal  regions, and pelvic cul-de-sac. It was necessary to proceed with endovaginal exam following the transabdominal exam to visualize the endometrium and ovaries. Color and duplex Doppler ultrasound was utilized to evaluate blood flow to the ovaries. COMPARISON:  Pelvic ultrasound dated 01/20/2018. FINDINGS: Uterus Measurements: 9.3 x 4.7 x 4.2 cm = volume: 95 mL. The uterus is anteverted. There is slight heterogeneity of the myometrium with findings of possible mild adenomyosis. Endometrium Thickness: 12 mm. The endometrium is thickened and contains mobile echogenic content likely blood products/clot. Right ovary Oophorectomy. Left ovary Measurements: 6.0 x 3.2 x 5.8 cm = volume: 58 mL. The left ovary is enlarged with increased echogenicity and peripherally displaced follicles. There is a 2.5 x 2.5 x 2.5 cm hemorrhagic cyst in the left ovary. There is almost 360 degree twisting of the pedicle of the left ovary highly concerning for a degree of torsion or intermittent torsion. Pulsed Doppler evaluation of both ovaries demonstrates normal low-resistance arterial and venous waveforms. Other findings There is small amount of simple appearing fluid within the pelvis. IMPRESSION: 1. Enlarged left ovary with sonographic findings concerning for torsion. Clinical correlation and gynecology consult is advised. Doppler images demonstrate some preserved arterial and venous flow within the ovary. 2. Probable blood clot within the endometrium. These results were called by telephone at the time of interpretation on 04/13/2019 at 11:43 pm to provider JOSHUA LONG , who verbally acknowledged these results. Electronically Signed   By: 04/15/2019 M.D.   On: 04/13/2019 23:57   04/15/2019 Pelvis Complete  Result Date: 04/13/2019 CLINICAL DATA:  24 year old female with history of prior right oophorectomy as well as prior  history of ovarian torsion presenting with intense left pelvic pain. EXAM: TRANSABDOMINAL AND TRANSVAGINAL ULTRASOUND OF  PELVIS DOPPLER ULTRASOUND OF OVARIES TECHNIQUE: Both transabdominal and transvaginal ultrasound examinations of the pelvis were performed. Transabdominal technique was performed for global imaging of the pelvis including uterus, ovaries, adnexal regions, and pelvic cul-de-sac. It was necessary to proceed with endovaginal exam following the transabdominal exam to visualize the endometrium and ovaries. Color and duplex Doppler ultrasound was utilized to evaluate blood flow to the ovaries. COMPARISON:  Pelvic ultrasound dated 01/20/2018. FINDINGS: Uterus Measurements: 9.3 x 4.7 x 4.2 cm = volume: 95 mL. The uterus is anteverted. There is slight heterogeneity of the myometrium with findings of possible mild adenomyosis. Endometrium Thickness: 12 mm. The endometrium is thickened and contains mobile echogenic content likely blood products/clot. Right ovary Oophorectomy. Left ovary Measurements: 6.0 x 3.2 x 5.8 cm = volume: 58 mL. The left ovary is enlarged with increased echogenicity and peripherally displaced follicles. There is a 2.5 x 2.5 x 2.5 cm hemorrhagic cyst in the left ovary. There is almost 360 degree twisting of the pedicle of the left ovary highly concerning for a degree of torsion or intermittent torsion. Pulsed Doppler evaluation of both ovaries demonstrates normal low-resistance arterial and venous waveforms. Other findings There is small amount of simple appearing fluid within the pelvis. IMPRESSION: 1. Enlarged left ovary with sonographic findings concerning for torsion. Clinical correlation and gynecology consult is advised. Doppler images demonstrate some preserved arterial and venous flow within the ovary. 2. Probable blood clot within the endometrium. These results were called by telephone at the time of interpretation on 04/13/2019 at 11:43 pm to provider JOSHUA LONG , who verbally acknowledged these results. Electronically Signed   By: Elgie Collard M.D.   On: 04/13/2019 23:57   [place] Art/Ven Flow  Abd Pelv Doppler  Result Date: 04/13/2019 CLINICAL DATA:  24 year old female with history of prior right oophorectomy as well as prior history of ovarian torsion presenting with intense left pelvic pain. EXAM: TRANSABDOMINAL AND TRANSVAGINAL ULTRASOUND OF PELVIS DOPPLER ULTRASOUND OF OVARIES TECHNIQUE: Both transabdominal and transvaginal ultrasound examinations of the pelvis were performed. Transabdominal technique was performed for global imaging of the pelvis including uterus, ovaries, adnexal regions, and pelvic cul-de-sac. It was necessary to proceed with endovaginal exam following the transabdominal exam to visualize the endometrium and ovaries. Color and duplex Doppler ultrasound was utilized to evaluate blood flow to the ovaries. COMPARISON:  Pelvic ultrasound dated 01/20/2018. FINDINGS: Uterus Measurements: 9.3 x 4.7 x 4.2 cm = volume: 95 mL. The uterus is anteverted. There is slight heterogeneity of the myometrium with findings of possible mild adenomyosis. Endometrium Thickness: 12 mm. The endometrium is thickened and contains mobile echogenic content likely blood products/clot. Right ovary Oophorectomy. Left ovary Measurements: 6.0 x 3.2 x 5.8 cm = volume: 58 mL. The left ovary is enlarged with increased echogenicity and peripherally displaced follicles. There is a 2.5 x 2.5 x 2.5 cm hemorrhagic cyst in the left ovary. There is almost 360 degree twisting of the pedicle of the left ovary highly concerning for a degree of torsion or intermittent torsion. Pulsed Doppler evaluation of both ovaries demonstrates normal low-resistance arterial and venous waveforms. Other findings There is small amount of simple appearing fluid within the pelvis. IMPRESSION: 1. Enlarged left ovary with sonographic findings concerning for torsion. Clinical correlation and gynecology consult is advised. Doppler images demonstrate some preserved arterial and venous flow within the ovary. 2. Probable blood clot within the  endometrium. These results  were called by telephone at the time of interpretation on 04/13/2019 at 11:43 pm to provider JOSHUA LONG , who verbally acknowledged these results. Electronically Signed   By: Anner Crete M.D.   On: 04/13/2019 23:57   CLINICAL DATA:  Initial evaluation for persistent left adnexal pain. History of prior right oophorectomy. Possible left sided torsion on recent ultrasound.  EXAM: TRANSABDOMINAL AND TRANSVAGINAL ULTRASOUND OF PELVIS  DOPPLER ULTRASOUND OF OVARIES  TECHNIQUE: Both transabdominal and transvaginal ultrasound examinations of the pelvis were performed. Transabdominal technique was performed for global imaging of the pelvis including uterus, ovaries, adnexal regions, and pelvic cul-de-sac.  It was necessary to proceed with endovaginal exam following the transabdominal exam to visualize the uterus, endometrium, and left ovary. Color and duplex Doppler ultrasound was utilized to evaluate blood flow to the ovaries.  COMPARISON:  Prior ultrasound from 04/13/2019  FINDINGS: Uterus  Measurements: 8.7 x 4.5 x 5.5 cm = volume: 114 mL. No fibroids or other mass visualized.  Endometrium  Thickness: 9 mm.  No focal abnormality visualized.  Right ovary  Prior right oophorectomy.  No adnexal mass.  Left ovary  Measurements: 6.2 x 2.9 x 5.6 cm = volume: 54 mL. Left ovary again appears somewhat enlarged with increased echogenicity and peripherally distributed follicles. 2.2 x 1.8 x 1.9 cm complex cyst with low level internal echoes and probable internal fluid-fluid level, relatively unchanged from previous.  Pulsed Doppler evaluation of the left ovary demonstrates normal low-resistance arterial and venous waveforms. On cine imaging, again seen is twisting and somewhat corkscrew appearance of the vascular pedicle, similar to previous exam, and suspected to possibly reflect a chronic finding. There appears to be preserved flow  within the torsed vessels.  Other findings  Small volume free fluid present within the pelvis.  IMPRESSION: 1. Relatively stable appearance of the left ovary as compared to 04/13/2019. Again seen is twisting of the vascular pedicle with preserved Doppler arterial and venous waveforms within the left ovary itself, suggesting that this may be a chronic and compensated finding. No evidence for overt torsion on today's exam. 2. Similar 2.2 cm complex left ovarian cyst, which could reflect a small hemorrhagic cyst or endometrioma.  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 Since pt is not having sx at present, I am worried about complete loss of blood flow to the ovary. Will repeat an [place] rec surgery if there is still blood flow to the ovary.   Pt wants surgical eval and is concerned about loss of the ovary.  She wants to preserve her fertility.   TVUS today ASAP.  Javontae Marlette L. Harraway-Smith, M.D., Cherlynn June

## 2019-04-21 ENCOUNTER — Other Ambulatory Visit (HOSPITAL_COMMUNITY)
Admission: RE | Admit: 2019-04-21 | Discharge: 2019-04-21 | Disposition: A | Discharge status: Home / Self Care | Payer: Medicaid Other | Source: Ambulatory Visit | Attending: Obstetrics & Gynecology | Admitting: Obstetrics & Gynecology

## 2019-04-21 ENCOUNTER — Encounter: Payer: Self-pay | Admitting: Obstetrics & Gynecology

## 2019-04-21 ENCOUNTER — Other Ambulatory Visit (HOSPITAL_BASED_OUTPATIENT_CLINIC_OR_DEPARTMENT_OTHER): Payer: Medicaid Other

## 2019-04-21 ENCOUNTER — Telehealth: Payer: Self-pay | Admitting: Obstetrics & Gynecology

## 2019-04-21 DIAGNOSIS — Z01812 Encounter for preprocedural laboratory examination: Secondary | ICD-10-CM | POA: Diagnosis not present

## 2019-04-21 DIAGNOSIS — Z20822 Contact with and (suspected) exposure to covid-19: Secondary | ICD-10-CM | POA: Diagnosis not present

## 2019-04-21 LAB — SARS CORONAVIRUS 2 (TAT 6-24 HRS): SARS Coronavirus 2: NEGATIVE

## 2019-04-21 NOTE — Progress Notes (Signed)
Unable to reach pt for pre-op call. Left detailed pre-op instructions on pt's voicemail.   Covid test done today. Instructed pt to quarantine until she comes to the hospital tomorrow.  Pt instructed to not eat food after midnight, may have clear liquids until 10 AM and then NPO.

## 2019-04-21 NOTE — Telephone Encounter (Signed)
TC to pt to review results of [place]. Discussed with pt management options. Pt is concerned about reoccurrence of pain and given that the torsion is still noted, she is requesting surgical intervention.   Ansen Sayegh L. Harraway-Smith, M.D., Evern Core

## 2019-04-22 ENCOUNTER — Ambulatory Visit (HOSPITAL_COMMUNITY)
Admission: RE | Admit: 2019-04-22 | Discharge: 2019-04-22 | Disposition: A | Discharge status: Home / Self Care | Payer: Medicaid Other | Attending: Family Medicine | Admitting: Family Medicine

## 2019-04-22 ENCOUNTER — Encounter (HOSPITAL_COMMUNITY): Payer: Self-pay | Admitting: Family Medicine

## 2019-04-22 ENCOUNTER — Encounter (HOSPITAL_COMMUNITY)
Admission: RE | Disposition: A | Discharge status: Home / Self Care | Payer: Self-pay | Source: Home / Self Care | Attending: Family Medicine

## 2019-04-22 ENCOUNTER — Ambulatory Visit (HOSPITAL_COMMUNITY): Payer: Medicaid Other | Admitting: Anesthesiology

## 2019-04-22 DIAGNOSIS — Z90721 Acquired absence of ovaries, unilateral: Secondary | ICD-10-CM | POA: Insufficient documentation

## 2019-04-22 DIAGNOSIS — N83512 Torsion of left ovary and ovarian pedicle: Secondary | ICD-10-CM | POA: Insufficient documentation

## 2019-04-22 DIAGNOSIS — N8353 Torsion of ovary, ovarian pedicle and fallopian tube: Secondary | ICD-10-CM | POA: Diagnosis not present

## 2019-04-22 DIAGNOSIS — N83202 Unspecified ovarian cyst, left side: Secondary | ICD-10-CM

## 2019-04-22 DIAGNOSIS — N83209 Unspecified ovarian cyst, unspecified side: Secondary | ICD-10-CM | POA: Diagnosis present

## 2019-04-22 HISTORY — PX: LAPAROSCOPIC OVARIAN CYSTECTOMY: SHX6248

## 2019-04-22 LAB — CBC
HCT: 38.5 % (ref 36.0–46.0)
Hemoglobin: 11.1 g/dL — ABNORMAL LOW (ref 12.0–15.0)
MCH: 23.8 pg — ABNORMAL LOW (ref 26.0–34.0)
MCHC: 28.8 g/dL — ABNORMAL LOW (ref 30.0–36.0)
MCV: 82.6 fL (ref 80.0–100.0)
Platelets: 224 10*3/uL (ref 150–400)
RBC: 4.66 MIL/uL (ref 3.87–5.11)
RDW: 20.2 % — ABNORMAL HIGH (ref 11.5–15.5)
WBC: 4.7 10*3/uL (ref 4.0–10.5)
nRBC: 0 % (ref 0.0–0.2)

## 2019-04-22 LAB — TYPE AND SCREEN
ABO/RH(D): A POS
Antibody Screen: NEGATIVE

## 2019-04-22 LAB — POCT PREGNANCY, URINE: Preg Test, Ur: NEGATIVE

## 2019-04-22 SURGERY — EXCISION, CYST, OVARY, LAPAROSCOPIC
Anesthesia: General | Site: Abdomen

## 2019-04-22 MED ORDER — ONDANSETRON HCL 4 MG/2ML IJ SOLN
INTRAMUSCULAR | Status: DC | PRN
  Administered 2019-04-22: 4 mg via INTRAVENOUS

## 2019-04-22 MED ORDER — FENTANYL CITRATE (PF) 100 MCG/2ML IJ SOLN: 25.0000 ug | INTRAMUSCULAR | Status: DC | PRN

## 2019-04-22 MED ORDER — FENTANYL CITRATE (PF) 250 MCG/5ML IJ SOLN
INTRAMUSCULAR | Status: DC | PRN
  Administered 2019-04-22: 50 ug via INTRAVENOUS
  Administered 2019-04-22: 100 ug via INTRAVENOUS
  Administered 2019-04-22: 50 ug via INTRAVENOUS

## 2019-04-22 MED ORDER — ONDANSETRON HCL 4 MG/2ML IJ SOLN
INTRAMUSCULAR | Status: AC
  Filled 2019-04-22: qty 6

## 2019-04-22 MED ORDER — KETOROLAC TROMETHAMINE 30 MG/ML IJ SOLN
INTRAMUSCULAR | Status: DC | PRN
  Administered 2019-04-22: 30 mg via INTRAVENOUS

## 2019-04-22 MED ORDER — MIDAZOLAM HCL 2 MG/2ML IJ SOLN
INTRAMUSCULAR | Status: AC
  Filled 2019-04-22: qty 2

## 2019-04-22 MED ORDER — ONDANSETRON HCL 4 MG/2ML IJ SOLN: 4.0000 mg | Freq: Once | INTRAMUSCULAR | Status: DC | PRN

## 2019-04-22 MED ORDER — KETOROLAC TROMETHAMINE 30 MG/ML IJ SOLN
INTRAMUSCULAR | Status: AC
  Filled 2019-04-22: qty 1

## 2019-04-22 MED ORDER — PROPOFOL 10 MG/ML IV BOLUS
INTRAVENOUS | Status: AC
  Filled 2019-04-22: qty 20

## 2019-04-22 MED ORDER — LIDOCAINE 2% (20 MG/ML) 5 ML SYRINGE
INTRAMUSCULAR | Status: AC
  Filled 2019-04-22: qty 10

## 2019-04-22 MED ORDER — DEXAMETHASONE SODIUM PHOSPHATE 10 MG/ML IJ SOLN
INTRAMUSCULAR | Status: AC
  Filled 2019-04-22: qty 2

## 2019-04-22 MED ORDER — SUCCINYLCHOLINE CHLORIDE 200 MG/10ML IV SOSY
PREFILLED_SYRINGE | INTRAVENOUS | Status: AC
  Filled 2019-04-22: qty 10

## 2019-04-22 MED ORDER — 0.9 % SODIUM CHLORIDE (POUR BTL) OPTIME
TOPICAL | Status: DC | PRN
  Administered 2019-04-22: 1000 mL

## 2019-04-22 MED ORDER — LACTATED RINGERS IV SOLN: INTRAVENOUS | Status: DC

## 2019-04-22 MED ORDER — PHENYLEPHRINE 40 MCG/ML (10ML) SYRINGE FOR IV PUSH (FOR BLOOD PRESSURE SUPPORT)
PREFILLED_SYRINGE | INTRAVENOUS | Status: AC
  Filled 2019-04-22: qty 20

## 2019-04-22 MED ORDER — OXYCODONE HCL 5 MG/5ML PO SOLN: 5.0000 mg | Freq: Once | ORAL | Status: AC | PRN

## 2019-04-22 MED ORDER — PHENYLEPHRINE 40 MCG/ML (10ML) SYRINGE FOR IV PUSH (FOR BLOOD PRESSURE SUPPORT)
PREFILLED_SYRINGE | INTRAVENOUS | Status: DC | PRN
  Administered 2019-04-22: 80 ug via INTRAVENOUS

## 2019-04-22 MED ORDER — MIDAZOLAM HCL 5 MG/5ML IJ SOLN
INTRAMUSCULAR | Status: DC | PRN
  Administered 2019-04-22: 2 mg via INTRAVENOUS

## 2019-04-22 MED ORDER — ROCURONIUM BROMIDE 10 MG/ML (PF) SYRINGE
PREFILLED_SYRINGE | INTRAVENOUS | Status: AC
  Filled 2019-04-22: qty 20

## 2019-04-22 MED ORDER — BUPIVACAINE HCL (PF) 0.25 % IJ SOLN
INTRAMUSCULAR | Status: DC | PRN
  Administered 2019-04-22: 11 mL

## 2019-04-22 MED ORDER — SODIUM CHLORIDE 0.9 % IR SOLN
Status: DC | PRN
  Administered 2019-04-22: 1000 mL

## 2019-04-22 MED ORDER — FENTANYL CITRATE (PF) 250 MCG/5ML IJ SOLN
INTRAMUSCULAR | Status: AC
  Filled 2019-04-22: qty 5

## 2019-04-22 MED ORDER — OXYCODONE HCL 5 MG PO TABS: 5.0000 mg | ORAL_TABLET | Freq: Four times a day (QID) | ORAL | 0 refills | Status: DC | PRN

## 2019-04-22 MED ORDER — GABAPENTIN 300 MG PO CAPS: 300.0000 mg | ORAL_CAPSULE | ORAL | Status: DC

## 2019-04-22 MED ORDER — OXYCODONE HCL 5 MG PO TABS
5.0000 mg | ORAL_TABLET | Freq: Once | ORAL | Status: AC | PRN
  Administered 2019-04-22: 15:00:00 5 mg via ORAL

## 2019-04-22 MED ORDER — DEXAMETHASONE SODIUM PHOSPHATE 10 MG/ML IJ SOLN
INTRAMUSCULAR | Status: DC | PRN
  Administered 2019-04-22: 10 mg via INTRAVENOUS

## 2019-04-22 MED ORDER — LACTATED RINGERS IV SOLN: INTRAVENOUS | Status: DC | PRN

## 2019-04-22 MED ORDER — LIDOCAINE 2% (20 MG/ML) 5 ML SYRINGE
INTRAMUSCULAR | Status: DC | PRN
  Administered 2019-04-22: 100 mg via INTRAVENOUS

## 2019-04-22 MED ORDER — ROCURONIUM BROMIDE 10 MG/ML (PF) SYRINGE
PREFILLED_SYRINGE | INTRAVENOUS | Status: DC | PRN
  Administered 2019-04-22: 40 mg via INTRAVENOUS

## 2019-04-22 MED ORDER — PROPOFOL 10 MG/ML IV BOLUS
INTRAVENOUS | Status: DC | PRN
  Administered 2019-04-22: 150 mg via INTRAVENOUS

## 2019-04-22 MED ORDER — BUPIVACAINE HCL (PF) 0.25 % IJ SOLN
INTRAMUSCULAR | Status: AC
  Filled 2019-04-22: qty 30

## 2019-04-22 MED ORDER — ACETAMINOPHEN 500 MG PO TABS: 1000.0000 mg | ORAL_TABLET | ORAL | Status: DC

## 2019-04-22 MED ORDER — OXYCODONE HCL 5 MG PO TABS
ORAL_TABLET | ORAL | Status: AC
  Filled 2019-04-22: qty 1

## 2019-04-22 SURGICAL SUPPLY — 31 items
ADH SKN CLS APL DERMABOND .7 (GAUZE/BANDAGES/DRESSINGS) ×1
BAG SPEC RTRVL LRG 6X4 10 (ENDOMECHANICALS)
BLADE SURG 15 STRL LF DISP TIS (BLADE) ×1 IMPLANT
BLADE SURG 15 STRL SS (BLADE) ×2
DERMABOND ADVANCED (GAUZE/BANDAGES/DRESSINGS) ×1
DERMABOND ADVANCED .7 DNX12 (GAUZE/BANDAGES/DRESSINGS) IMPLANT
DRSG OPSITE POSTOP 3X4 (GAUZE/BANDAGES/DRESSINGS) IMPLANT
DURAPREP 26ML APPLICATOR (WOUND CARE) ×2 IMPLANT
GLOVE BIOGEL PI IND STRL 7.0 (GLOVE) ×4 IMPLANT
GLOVE BIOGEL PI INDICATOR 7.0 (GLOVE) ×4
GLOVE ECLIPSE 7.0 STRL STRAW (GLOVE) ×2 IMPLANT
GOWN STRL REUS W/ TWL LRG LVL3 (GOWN DISPOSABLE) ×3 IMPLANT
GOWN STRL REUS W/TWL LRG LVL3 (GOWN DISPOSABLE) ×6
KIT TURNOVER KIT B (KITS) ×2 IMPLANT
PACK LAPAROSCOPY BASIN (CUSTOM PROCEDURE TRAY) ×2 IMPLANT
PACK TRENDGUARD 450 HYBRID PRO (MISCELLANEOUS) IMPLANT
POUCH SPECIMEN RETRIEVAL 10MM (ENDOMECHANICALS) IMPLANT
PROTECTOR NERVE ULNAR (MISCELLANEOUS) ×4 IMPLANT
SET IRRIG TUBING LAPAROSCOPIC (IRRIGATION / IRRIGATOR) IMPLANT
SET TUBE SMOKE EVAC HIGH FLOW (TUBING) ×2 IMPLANT
SHEARS HARMONIC ACE PLUS 36CM (ENDOMECHANICALS) IMPLANT
SLEEVE ENDOPATH XCEL 5M (ENDOMECHANICALS) ×2 IMPLANT
SUT VIC AB 3-0 X1 27 (SUTURE) ×2 IMPLANT
SUT VICRYL 0 UR6 27IN ABS (SUTURE) ×4 IMPLANT
TOWEL GREEN STERILE FF (TOWEL DISPOSABLE) ×4 IMPLANT
TRAY FOLEY W/BAG SLVR 14FR (SET/KITS/TRAYS/PACK) ×2 IMPLANT
TRENDGUARD 450 HYBRID PRO PACK (MISCELLANEOUS)
TROCAR BALLN 12MMX100 BLUNT (TROCAR) ×2 IMPLANT
TROCAR XCEL NON-BLD 11X100MML (ENDOMECHANICALS) IMPLANT
TROCAR XCEL NON-BLD 5MMX100MML (ENDOMECHANICALS) ×2 IMPLANT
WARMER LAPAROSCOPE (MISCELLANEOUS) ×2 IMPLANT

## 2019-04-22 NOTE — Discharge Instructions (Signed)
Minimally Invasive Ovarian Torsion Surgery, Care After This sheet gives you information about how to care for yourself after your procedure. Your health care provider may also give you more specific instructions. If you have problems or questions, contact your health care provider. What can I expect after the procedure? After the procedure, it is common to have:  Mild cramping in your lower abdomen.  Tiredness (fatigue). You may feel tired for a few days. Follow these instructions at home: Incision care   Follow instructions from your health care provider about how to take care of your incisions. Make sure you: ? Wash your hands with soap and water before and after you change your bandage (dressing). If soap and water are not available, use hand sanitizer. ? Change your dressing as told by your health care provider. ? Leave stitches (sutures), skin glue, or adhesive strips in place. These skin closures may need to stay in place for 2 weeks or longer. If adhesive strip edges start to loosen and curl up, you may trim the loose edges. Do not remove adhesive strips completely unless your health care provider tells you to do that.  Check your incision areas every day for signs of infection. Check for: ? Redness, swelling, or pain that gets worse. ? Fluid or blood. ? Warmth. ? Pus or a bad smell.  Keep your incision areas clean and dry.  Do not take baths, swim, or use a hot tub until your health care provider approves. Ask your health care provider if you may take showers. You may only be allowed to take sponge baths. Activity  Return to your normal activities as told by your health care provider. Ask your health care provider what activities are safe for you. Ask about: ? Returning to work and exercise. ? Any weight restrictions you may have. ? When you can resume sexual activity.  Rest as told by your health care provider.  Avoid sitting for a long time without moving. Get up to take  short walks every 1-2 hours. This is important to improve blood flow and breathing. Ask for help if you feel weak or unsteady. General instructions  Take over-the-counter and prescription medicines only as told by your health care provider.  Ask your health care provider if the medicine prescribed to you: ? Requires you to avoid driving or using heavy machinery. ? Can cause constipation. You may need to take these actions to prevent or treat constipation:  Drink enough fluid to keep your urine pale yellow.  Take over-the-counter or prescription medicines.  Eat foods that are high in fiber, such as beans, whole grains, and fresh fruits and vegetables.  Limit foods that are high in fat and processed sugars, such as fried or sweet foods.  Keep all follow-up visits as told by your health care provider. This is important. Contact a health care provider if:  You have unusual bleeding or discharge from your vagina.  You have pain in your abdomen that gets worse or does not get better with medicine.  You feel nauseous.  You have redness, swelling, or pain around any of your incisions.  You have fluid or blood coming from any of your incisions.  Any of your incisions feel warm to the touch.  You have pus or a bad smell coming from any of your incisions. Get help right away if:  You have a fever.  You have a lot of unusual bleeding or discharge from your vagina.  You have severe pain  in your abdomen.  You cannot stop vomiting.  You have nausea that does not get better.  You faint.  You are unable to empty your bladder. Summary  After the procedure, it is common to have mild cramping and to feel tired for a few days.  Return to your normal activities as told by your health care provider. Ask your health care provider what activities are safe for you.  Contact a health care provider if you have complications, such as unusual bleeding or discharge, nausea, pain that gets  worse, or fluid or blood coming from any of your incisions.  Keep all follow-up visits as told by your health care provider. This information is not intended to replace advice given to you by your health care provider. Make sure you discuss any questions you have with your health care provider. Document Revised: 03/31/2018 Document Reviewed: 03/31/2018 Elsevier Patient Education  [place].

## 2019-04-22 NOTE — H&P (Signed)
  [name] is an 24 y.o. G60P1011 female.   Chief Complaint: intermittent left ovarian pain, with cyst HPI: Patient with findings of intermittent ovarian torsion. For surgical treatment. She has had prior RSO due to same.  Past Medical History:  Diagnosis Date  . Eczema     Past Surgical History:  Procedure Laterality Date  . ovary removed      Family History  Problem Relation Age of Onset  . Aneurysm Mother   . Fibroids Mother    Social History:  reports that she has never smoked. She has never used smokeless tobacco. She reports current alcohol use. She reports that she does not use drugs.  Allergies:  Allergies  Allergen Reactions  . Latex Rash    Facility-Administered Medications Prior to Admission  Medication Dose Route Frequency Provider Last Rate Last Admin  . Tdap (BOOSTRIX) injection 0.5 mL  0.5 mL Intramuscular Once Willodean Rosenthal, MD       Medications Prior to Admission  Medication Sig Dispense Refill  . ibuprofen (ADVIL) 200 MG tablet Take 600-800 mg by mouth every 6 (six) hours as needed for headache or moderate pain.    Marland Kitchen MAGNESIUM PO Take 1 tablet by mouth daily.    Marland Kitchen VITAMIN D PO Take 1 tablet by mouth daily.    Marland Kitchen ketorolac (TORADOL) 10 MG tablet Take 1 tablet (10 mg total) by mouth every 8 (eight) hours as needed. (Patient not taking: Reported on 04/20/2019) 15 tablet 0  . oxyCODONE-acetaminophen (PERCOCET/ROXICET) 5-325 MG tablet Take 1 tablet by mouth every 4 (four) hours as needed for severe pain. (Patient not taking: Reported on 04/20/2019) 20 tablet 0    A comprehensive review of systems was negative.  Blood pressure 109/71, pulse 86, temperature 98.2 F (36.8 C), temperature source Oral, resp. rate 17, height 5\' 4"  (1.626 m), weight 68.5 kg, last menstrual period 04/13/2019, SpO2 99 %. BP 109/71   Pulse 86   Temp 98.2 F (36.8 C) (Oral)   Resp 17   Ht 5\' 4"  (1.626 m)   Wt 68.5 kg   LMP 04/13/2019   SpO2 99%   BMI 25.92 kg/m   General appearance: alert, cooperative and appears stated age Neck: supple, symmetrical, trachea midline Lungs: normal effort Heart: regular rate and rhythm Abdomen: soft, non-tender; bowel sounds normal; no masses,  no organomegaly Extremities: extremities normal, atraumatic, no cyanosis or edema Skin: Skin color, texture, turgor normal. No rashes or lesions Neurologic: Grossly normal   Lab Results  Component Value Date   WBC 4.7 04/22/2019   HGB 11.1 (L) 04/22/2019   HCT 38.5 04/22/2019   MCV 82.6 04/22/2019   PLT 224 04/22/2019   Lab Results  Component Value Date   PREGTESTUR NEGATIVE 04/22/2019     Assessment/Plan Active Problems:   Ovarian cyst  For Laparoscopic detorsion and ovarian cystectomy.  Risks include but are not limited to bleeding, infection, injury to surrounding structures, including bowel, bladder and ureters, blood clots, and death.  Likelihood of success is high.    04/24/2019 04/22/2019, 12:43 PM

## 2019-04-22 NOTE — Anesthesia Postprocedure Evaluation (Signed)
Anesthesia Post Note  Patient: [name]  Procedure(s) Performed: LAPAROSCOPIC OVARIAN CYSTECTOMY & DETORSION (N/A Abdomen)     Patient location during evaluation: PACU Anesthesia Type: General Level of consciousness: sedated Pain management: pain level controlled Vital Signs Assessment: post-procedure vital signs reviewed and stable Respiratory status: spontaneous breathing and respiratory function stable Cardiovascular status: stable Postop Assessment: no apparent nausea or vomiting Anesthetic complications: no    Last Vitals:  Vitals:   04/22/19 1424 04/22/19 1431  BP: 118/77 117/84  Pulse: 78 92  Resp: 17 20  Temp:  (!) 36 C  SpO2: 98% 99%    Last Pain:  Vitals:   04/22/19 1431  TempSrc:   PainSc: 0-No pain                 [name]

## 2019-04-22 NOTE — Transfer of Care (Signed)
Immediate Anesthesia Transfer of Care Note  Patient: [name]  Procedure(s) Performed: LAPAROSCOPIC OVARIAN CYSTECTOMY & DETORSION (N/A Abdomen)  Patient Location: PACU  Anesthesia Type:General  Level of Consciousness: awake, alert  and oriented  Airway & Oxygen Therapy: Patient Spontanous Breathing and Patient connected to nasal cannula oxygen  Post-op Assessment: Report given to RN and Post -op Vital signs reviewed and stable  Post vital signs: Reviewed and stable  Last Vitals:  Vitals Value Taken Time  BP    Temp    Pulse    Resp    SpO2      Last Pain:  Vitals:   04/22/19 1105  TempSrc:   PainSc: 0-No pain         Complications: No apparent anesthesia complications

## 2019-04-22 NOTE — Anesthesia Preprocedure Evaluation (Addendum)

## 2019-04-22 NOTE — Anesthesia Procedure Notes (Signed)
Procedure Name: Intubation Date/Time: 04/22/2019 12:58 PM Performed by: Wilburn Cornelia, CRNA Pre-anesthesia Checklist: Patient identified, Emergency Drugs available, Suction available, Patient being monitored and Timeout performed Patient Re-evaluated:Patient Re-evaluated prior to induction Oxygen Delivery Method: Circle system utilized Preoxygenation: Pre-oxygenation with 100% oxygen Induction Type: IV induction Ventilation: Mask ventilation without difficulty Laryngoscope Size: Mac and 3 Grade View: Grade I Tube type: Oral Tube size: 7.0 mm Number of attempts: 1 Airway Equipment and Method: Stylet Placement Confirmation: ETT inserted through vocal cords under direct vision,  positive ETCO2,  CO2 detector and breath sounds checked- equal and bilateral Secured at: 21 cm Tube secured with: Tape Dental Injury: Teeth and Oropharynx as per pre-operative assessment

## 2019-04-22 NOTE — Op Note (Signed)
PROCEDURE DATE: 04/22/2019   PREOPERATIVE DIAGNOSES: Intermittent torsion  POSTOPERATIVE DIAGNOSES: The same  PROCEDURE: Laparoscopic left ovarian detorsion and oophorostomy   SURGEON: Dr. Reva Bores   ASSISTANT: Herma Mering, RNFA  ANESTHESIOLOGIST: Bethena Midget, MDMD   INDICATIONS: 24 y.o. [phone] with history of pelvic pain found to have probable torsion, with h/o RSO for the same in 2009.  FINDINGS: enlarged, edematous left ovary and tube, normal uterus    ESTIMATED BLOOD LOSS: 100 ml   SPECIMENS: None  COMPLICATIONS: None immediately known   PROCEDURE IN DETAIL: The patient had sequential compression devices applied to her lower extremities while in the preoperative area. She was then taken to the operating room where general anesthesia was administered and was found to be adequate. She was placed in the dorsal lithotomy position, and was prepped and draped in a sterile manner. A Foley catheter was inserted into her bladder and attached to constant drainage and a speculum placed. Cervix grasped with a single tooth tenaculum and a Hulka tenaculum placed. Speculum and tenaculum removed from the vagina.  After an adequate timeout was performed, attention was then turned to the patient's abdomen where a 11-mm skin incision was made in the umbilicus. This was carried down to the underlying fascia and peritoneum.  The fascia was tagged with 0 Vicryl suture on a UR-6 in a purse string fashion.. Intraperitoneal placement was confirmed and insufflation done. A survey of the patient's pelvis and abdomen revealed the findings above. Bilateral 5-mm lower quadrant ports were then placed under direct visualization. On the left side, the left pedicle had a 360 degree twist noted. This was untwisted. The Harmonic scalpel was used to make a linear incision in the ovarian parenchyma, no fluid noted, but oozing blood. A second linear incision made to allow the edema to flow out  The operative site  was surveyed, and found to be hemostatic. No intraoperative injury to other surrounding organs was noted. The tube was brought up form the posterior cul-de-sac to stay level with the uterus and not allow re-torsion. The abdomen was desufflated and all instruments were then removed from the patient's abdomen. The fascia was closed with the purse string Vicryl placed at beginning of the procedure.  All skin incisions were closed with 3-0 Vicryl subcuticular stitches/Dermabond.   Reva Bores MD 04/22/2019 1:58 PM

## 2019-04-23 LAB — ABO/RH: ABO/RH(D): A POS

## 2019-04-26 ENCOUNTER — Other Ambulatory Visit: Payer: Medicaid Other

## 2019-04-26 ENCOUNTER — Encounter: Payer: Medicaid Other | Admitting: Obstetrics & Gynecology

## 2019-04-26 DIAGNOSIS — F341 Dysthymic disorder: Secondary | ICD-10-CM | POA: Diagnosis not present

## 2019-05-04 ENCOUNTER — Encounter: Payer: Self-pay | Admitting: Obstetrics & Gynecology

## 2019-05-04 ENCOUNTER — Ambulatory Visit (INDEPENDENT_AMBULATORY_CARE_PROVIDER_SITE_OTHER): Payer: Medicaid Other | Admitting: Obstetrics & Gynecology

## 2019-05-04 ENCOUNTER — Other Ambulatory Visit: Payer: Self-pay

## 2019-05-04 VITALS — Ht 64.0 in | Wt 152.0 lb

## 2019-05-04 DIAGNOSIS — Z9889 Other specified postprocedural states: Secondary | ICD-10-CM

## 2019-05-04 NOTE — Progress Notes (Signed)
History:  24 y.o.LMP here today for 2 week post op check following laparoscopy for ov torsion. Pt reports that she is doing well. She feels much better. She denies any pain at present.   This is her 2nd ovarian torsion. No cyst was noted at the time of surgery. She reports that she is eating and passing flatus and stols without difficulty.     The following portions of the patient's history were reviewed and updated as appropriate: allergies, current medications, past family history, past medical history, past social history, past surgical history and problem list.  Review of Systems:  Pertinent items are noted in HPI.    Objective:  Physical Exam Ht 5\' 4"  (1.626 m)   Wt 152 lb (68.9 kg)   LMP 04/13/2019   BMI 26.09 kg/m   CONSTITUTIONAL: Well-developed, well-nourished female in no acute distress.  HENT:  Normocephalic, atraumatic EYES: Conjunctivae and EOM are normal. No scleral icterus.  NECK: Normal range of motion SKIN: Skin is warm and dry. No rash noted. Not diaphoretic.No pallor. NEUROLGIC: Alert and oriented to person, place, and time. Normal coordination.  Abd: Soft, nontender and nondistended; her port sites are healing well. Pelvic: deferred   Assessment & Plan:  2 week post op check following laparoscopy with detortion of ovary. Doing well  Reviewed post op instructions and activities  Gradual increase in activities  F/u in 6 months or sooner prn  All questions answered.   Jeryl Umholtz L. Harraway-Smith, M.D., 04/15/2019

## 2019-06-07 DIAGNOSIS — F341 Dysthymic disorder: Secondary | ICD-10-CM | POA: Diagnosis not present

## 2019-06-11 DIAGNOSIS — F341 Dysthymic disorder: Secondary | ICD-10-CM | POA: Diagnosis not present

## 2019-07-05 DIAGNOSIS — F341 Dysthymic disorder: Secondary | ICD-10-CM | POA: Diagnosis not present

## 2019-07-09 DIAGNOSIS — F341 Dysthymic disorder: Secondary | ICD-10-CM | POA: Diagnosis not present

## 2019-07-11 DIAGNOSIS — F341 Dysthymic disorder: Secondary | ICD-10-CM | POA: Diagnosis not present

## 2019-07-20 ENCOUNTER — Telehealth: Payer: Self-pay

## 2019-07-20 NOTE — Telephone Encounter (Signed)
Patient states for the last week she has had some generalized pain in the lower vaginal area. Patient states it has become more painful as the week has gone one. Patient states that she also experiences this pain with intercourse. Patient given appointment tomorrow with Dr. Rod Can RN

## 2019-07-21 ENCOUNTER — Ambulatory Visit: Payer: Medicaid Other | Admitting: Obstetrics & Gynecology

## 2019-08-05 DIAGNOSIS — F341 Dysthymic disorder: Secondary | ICD-10-CM | POA: Diagnosis not present

## 2019-08-23 DIAGNOSIS — F341 Dysthymic disorder: Secondary | ICD-10-CM | POA: Diagnosis not present

## 2019-09-15 DIAGNOSIS — F341 Dysthymic disorder: Secondary | ICD-10-CM | POA: Diagnosis not present

## 2019-10-28 ENCOUNTER — Other Ambulatory Visit: Payer: Medicaid Other

## 2019-10-28 DIAGNOSIS — Z20822 Contact with and (suspected) exposure to covid-19: Secondary | ICD-10-CM | POA: Diagnosis not present

## 2019-10-30 LAB — NOVEL CORONAVIRUS, NAA: SARS-CoV-2, NAA: NOT DETECTED

## 2019-10-30 LAB — SARS-COV-2, NAA 2 DAY TAT

## 2019-11-02 ENCOUNTER — Telehealth: Payer: Self-pay

## 2019-11-02 DIAGNOSIS — B379 Candidiasis, unspecified: Secondary | ICD-10-CM

## 2019-11-02 DIAGNOSIS — N390 Urinary tract infection, site not specified: Secondary | ICD-10-CM

## 2019-11-02 MED ORDER — NITROFURANTOIN MONOHYD MACRO 100 MG PO CAPS: 100.0000 mg | ORAL_CAPSULE | Freq: Two times a day (BID) | ORAL | 0 refills | Status: DC

## 2019-11-02 MED ORDER — PHENAZOPYRIDINE HCL 200 MG PO TABS: 200.0000 mg | ORAL_TABLET | Freq: Three times a day (TID) | ORAL | 0 refills | Status: DC

## 2019-11-02 MED ORDER — FLUCONAZOLE 150 MG PO TABS: ORAL_TABLET | ORAL | 1 refills | Status: DC

## 2019-11-02 NOTE — Telephone Encounter (Signed)
Pt called requesting a Rx for UTI symptoms. Pt states she has been experiencing burning, pain, and odor with urination for more than 1 week. Pt states she is unable to come into the office due to having COVID symptoms. Macrobid 100 mg PO BID x 5 days and Pyridium 200 mg PO TID x 2 days was sent to pt's pharmacy. Pt made aware that she will need to schedule an appointment  if symptoms doesn't improve. Understanding was voiced.  Alcides Nutting l Salahuddin Arismendez, CMA

## 2020-05-21 ENCOUNTER — Other Ambulatory Visit: Payer: Self-pay

## 2020-05-21 ENCOUNTER — Encounter (HOSPITAL_BASED_OUTPATIENT_CLINIC_OR_DEPARTMENT_OTHER): Payer: Self-pay | Admitting: *Deleted

## 2020-05-21 ENCOUNTER — Emergency Department (HOSPITAL_BASED_OUTPATIENT_CLINIC_OR_DEPARTMENT_OTHER)
Admission: EM | Admit: 2020-05-21 | Discharge: 2020-05-21 | Disposition: A | Discharge status: Home / Self Care | Payer: Medicaid Other | Attending: Emergency Medicine | Admitting: Emergency Medicine

## 2020-05-21 DIAGNOSIS — Z91040 Latex allergy status: Secondary | ICD-10-CM | POA: Diagnosis not present

## 2020-05-21 DIAGNOSIS — B349 Viral infection, unspecified: Secondary | ICD-10-CM | POA: Diagnosis not present

## 2020-05-21 DIAGNOSIS — E876 Hypokalemia: Secondary | ICD-10-CM

## 2020-05-21 DIAGNOSIS — R112 Nausea with vomiting, unspecified: Secondary | ICD-10-CM | POA: Diagnosis present

## 2020-05-21 DIAGNOSIS — Z20822 Contact with and (suspected) exposure to covid-19: Secondary | ICD-10-CM | POA: Insufficient documentation

## 2020-05-21 LAB — BASIC METABOLIC PANEL
Anion gap: 12 (ref 5–15)
BUN: 13 mg/dL (ref 6–20)
CO2: 18 mmol/L — ABNORMAL LOW (ref 22–32)
Calcium: 9.1 mg/dL (ref 8.9–10.3)
Chloride: 107 mmol/L (ref 98–111)
Creatinine, Ser: 0.7 mg/dL (ref 0.44–1.00)
GFR, Estimated: >60 mL/min (ref >60)
Glucose, Bld: 85 mg/dL (ref 70–99)
Potassium: 2.9 mmol/L — ABNORMAL LOW (ref 3.5–5.1)
Sodium: 137 mmol/L (ref 135–145)

## 2020-05-21 LAB — CBC WITH DIFFERENTIAL/PLATELET
Abs Immature Granulocytes: 0.01 10*3/uL (ref 0.00–0.07)
Basophils Absolute: 0 10*3/uL (ref 0.0–0.1)
Basophils Relative: 0 %
Eosinophils Absolute: 0 10*3/uL (ref 0.0–0.5)
Eosinophils Relative: 0 %
HCT: 41.2 % (ref 36.0–46.0)
Hemoglobin: 12.3 g/dL (ref 12.0–15.0)
Immature Granulocytes: 0 %
Lymphocytes Relative: 28 %
Lymphs Abs: 1.9 10*3/uL (ref 0.7–4.0)
MCH: 21.5 pg — ABNORMAL LOW (ref 26.0–34.0)
MCHC: 29.9 g/dL — ABNORMAL LOW (ref 30.0–36.0)
MCV: 71.9 fL — ABNORMAL LOW (ref 80.0–100.0)
Monocytes Absolute: 0.4 10*3/uL (ref 0.1–1.0)
Monocytes Relative: 6 %
Neutro Abs: 4.4 10*3/uL (ref 1.7–7.7)
Neutrophils Relative %: 66 %
Platelets: 234 10*3/uL (ref 150–400)
RBC: 5.73 MIL/uL — ABNORMAL HIGH (ref 3.87–5.11)
RDW: 19.6 % — ABNORMAL HIGH (ref 11.5–15.5)
WBC: 6.8 10*3/uL (ref 4.0–10.5)
nRBC: 0 % (ref 0.0–0.2)

## 2020-05-21 MED ORDER — POTASSIUM CHLORIDE ER 10 MEQ PO TBCR: 10.0000 meq | EXTENDED_RELEASE_TABLET | Freq: Every day | ORAL | 0 refills | Status: DC

## 2020-05-21 MED ORDER — ONDANSETRON 4 MG PO TBDP
4.0000 mg | ORAL_TABLET | Freq: Once | ORAL | Status: AC
  Administered 2020-05-21: 4 mg via ORAL
  Filled 2020-05-21: qty 1

## 2020-05-21 MED ORDER — ONDANSETRON 4 MG PO TBDP: 4.0000 mg | ORAL_TABLET | Freq: Three times a day (TID) | ORAL | 0 refills | Status: DC | PRN

## 2020-05-21 MED ORDER — POTASSIUM CHLORIDE CRYS ER 20 MEQ PO TBCR
40.0000 meq | EXTENDED_RELEASE_TABLET | Freq: Once | ORAL | Status: AC
  Administered 2020-05-21: 40 meq via ORAL
  Filled 2020-05-21: qty 2

## 2020-05-21 NOTE — Discharge Instructions (Addendum)
Your nausea, vomiting, and diarrhea is likely in the context of a viral illness.  Given that your boyfriend and son have also been sick recently, suspect communicable illness.  For that reason, we will like for you to abstain from returning to work for another couple of days.  Your laboratory work-up was notable only for hypokalemia (low potassium).  This is not surprising given your symptoms.  I encourage you to take over-the-counter loperamide (Imodium) for your diarrhea symptoms.  Please check your temperature regularly and take Tylenol as needed for fever control.  I have also prescribed Zofran ODT which can take for nausea, as needed.  Take the potassium chloride daily for the next 10 days.  Please follow-up with your primary care provider for laboratory recheck and for ongoing evaluation and management.  If you do not have one, please get established as soon as possible.  Return to the ER or seek immediate medical attention should you experience any new or worsening symptoms.

## 2020-05-21 NOTE — ED Provider Notes (Signed)
[facility] EMERGENCY DEPARTMENT Provider Note   CSN: [id number] Arrival date & time: 05/21/20  1346     History Chief Complaint  Patient presents with  . Diarrhea    [name] is a 25 y.o. female with no relevant past medical history presents the ED with 6-day history of nausea, vomiting, and diarrhea.  On my examination, patient reports that she lives with her boyfriend and son who have also been ill recently, but with URI symptoms.  3 of them were all tested for COVID-19 and negative.  She presumed it was influenza, but has not since been tested.  She states that she is having loose bowel movements every 2 hours.  She has not taken anything for her diarrhea.  She denies any previous infection or recent treatment with antibiotics.  She denies personal history of malignancy or being immunocompromised in any way.  She also states that she has been having nausea and nonbloody emesis.  She is able to drink, but will occasionally have episodes of vomiting.  She tried taking an over-the-counter nausea medication, without any significant relief.  She had a fever of 102 F a few days ago, but she did not take anything for it and it went away.  She has not had any fevers since.  She simply feels fatigued.  She denies any chest pain, shortness of breath, cough, abdominal pain, pain with defecation, dysuria, increased urinary frequency, dyspareunia, unusual vaginal discharge, or any other symptoms.  She is currently on day 2 of menses.  HPI     Past Medical History:  Diagnosis Date  . Eczema     Patient Active Problem List   Diagnosis Date Noted  . Ovarian cyst 04/22/2019    Past Surgical History:  Procedure Laterality Date  . LAPAROSCOPIC OVARIAN CYSTECTOMY N/A 04/22/2019   Procedure: LAPAROSCOPIC OVARIAN CYSTECTOMY & DETORSION;  Surgeon: Reva Bores, MD;  Location: [facility] OR;  Service: Gynecology;  Laterality: N/A;  Tracie RNFA confirmed with Chassity  . ovary removed        OB History    Gravida  2   Para  1   Term  1   Preterm      AB  1   Living  1     SAB      IAB  1   Ectopic      Multiple  0   Live Births  1           Family History  Problem Relation Age of Onset  . Aneurysm Mother   . Fibroids Mother     Social History   Tobacco Use  . Smoking status: Never Smoker  . Smokeless tobacco: Never Used  Substance Use Topics  . Alcohol use: Yes    Comment: occ  . Drug use: No    Home Medications Prior to Admission medications   Medication Sig Start Date End Date Taking? Authorizing Provider  ondansetron (ZOFRAN ODT) 4 MG disintegrating tablet Take 1 tablet (4 mg total) by mouth every 8 (eight) hours as needed for nausea or vomiting. 05/21/20  Yes Lorelee New, PA-C  potassium chloride (KLOR-CON) 10 MEQ tablet Take 1 tablet (10 mEq total) by mouth daily for 10 days. 05/21/20 05/31/20 Yes Lorelee New, PA-C  fluconazole (DIFLUCAN) 150 MG tablet Take 1 tablet by mouth. Repeat in 3 days if symptoms persists. 11/02/19   Willodean Rosenthal, MD  ibuprofen (ADVIL) 200 MG tablet Take 600-800 mg by  mouth every 6 (six) hours as needed for headache or moderate pain.    [provider]  MAGNESIUM PO Take 1 tablet by mouth daily.    [provider]  nitrofurantoin, macrocrystal-monohydrate, (MACROBID) 100 MG capsule Take 1 capsule (100 mg total) by mouth 2 (two) times daily. 11/02/19   Willodean Rosenthal, MD  oxyCODONE (OXY IR/ROXICODONE) 5 MG immediate release tablet Take 1 tablet (5 mg total) by mouth every 6 (six) hours as needed for severe pain. Patient not taking: Reported on 05/04/2019 04/22/19   Reva Bores, MD  phenazopyridine (PYRIDIUM) 200 MG tablet Take 1 tablet (200 mg total) by mouth in the morning, at noon, and at bedtime. 11/02/19   Willodean Rosenthal, MD  VITAMIN D PO Take 1 tablet by mouth daily.    [provider]    Allergies    Latex  Review of Systems   Review of  Systems  All other systems reviewed and are negative.   Physical Exam Updated Vital Signs BP 108/71   Pulse 72   Temp 98.5 F (36.9 C) (Oral)   Resp 20   Ht 5\' 4"  (1.626 m)   Wt 72.6 kg   LMP 05/19/2020   SpO2 97%   BMI 27.46 kg/m   Physical Exam Vitals and nursing note reviewed. Exam conducted with a chaperone present.  Constitutional:      General: She is not in acute distress.    Appearance: Normal appearance. She is not toxic-appearing.  HENT:     Head: Normocephalic and atraumatic.  Eyes:     General: No scleral icterus.    Conjunctiva/sclera: Conjunctivae normal.  Cardiovascular:     Rate and Rhythm: Normal rate.     Pulses: Normal pulses.  Pulmonary:     Effort: Pulmonary effort is normal. No respiratory distress.  Abdominal:     General: Abdomen is flat. There is no distension.     Palpations: Abdomen is soft. There is no mass.     Tenderness: There is no abdominal tenderness.     Comments: Soft, nondistended.  No areas of tenderness.  No organomegaly palpated on exam.  Musculoskeletal:        General: Normal range of motion.  Skin:    General: Skin is dry.  Neurological:     Mental Status: She is alert.     GCS: GCS eye subscore is 4. GCS verbal subscore is 5. GCS motor subscore is 6.  Psychiatric:        Mood and Affect: Mood normal.        Behavior: Behavior normal.        Thought Content: Thought content normal.     ED Results / Procedures / Treatments   Labs (all labs ordered are listed, but only abnormal results are displayed) Labs Reviewed  CBC WITH DIFFERENTIAL/PLATELET - Abnormal; Notable for the following components:      Result Value   RBC 5.73 (*)    MCV 71.9 (*)    MCH 21.5 (*)    MCHC 29.9 (*)    RDW 19.6 (*)    All other components within normal limits  BASIC METABOLIC PANEL - Abnormal; Notable for the following components:   Potassium 2.9 (*)    CO2 18 (*)    All other components within normal limits     EKG None  Radiology No results found.  Procedures Procedures   Medications Ordered in ED Medications  ondansetron (ZOFRAN-ODT) disintegrating tablet 4 mg (4  mg Oral Given 05/21/20 1545)  potassium chloride SA (KLOR-CON) CR tablet 40 mEq (40 mEq Oral Given 05/21/20 1720)    ED Course  I have reviewed the triage vital signs and the nursing notes.  Pertinent labs & imaging results that were available during my care of the patient were reviewed by me and considered in my medical decision making (see chart for details).    MDM Rules/Calculators/A&P                          [name] was evaluated in Emergency Department on 05/21/2020 for the symptoms described in the history of present illness. She was evaluated in the context of the global COVID-19 pandemic, which necessitated consideration that the patient might be at risk for infection with the SARS-CoV-2 virus that causes COVID-19. Institutional protocols and algorithms that pertain to the evaluation of patients at risk for COVID-19 are in a state of rapid change based on information released by regulatory bodies including the CDC and federal and state organizations. These policies and algorithms were followed during the patient's care in the ED.  I personally reviewed patient's medical chart and all notes from triage and staff during today's encounter. I have also ordered and reviewed all labs and imaging that I felt to be medically necessary in the evaluation of this patient's complaints and with consideration of their physical exam. If needed, translation services were available and utilized.   Patient with a 6-day history of nausea, vomiting, and diarrhea.  She has had multiple sick contacts at home, albeit they had URI symptoms.  Her history is suggestive of communicable illness, likely viral.  She denies any recent antibiotics, abdominal pain, hematemesis, or bloody stools.  She has already tested negative for COVID-19 and  does not require repeat testing.  She is also day 6 into her course of illness, no need for influenza testing.  She had infectious mononucleosis when she was in college this feels different.  No organomegaly on my exam.  There is no abdominal tenderness either and I have low suspicion for bacterial pathology.  I will obtain basic laboratory work-up to assess for electrolyte derangement given frequency of diarrhea and emesis, however did not feel as though CT imaging is warranted given reassuring exam.  Will provide Zofran ODT and encouraged to eat and drink here in the ED.  Suspect that she will be able to be discharged home with continued isolation precautions and antiemetics.  We will also encourage her to use loperamide as needed for her diarrhea symptoms.  Patient was hypokalemic to 2.9 with mildly reduced bicarb to 18, both of which to be expected in context of nausea, vomiting, and diarrhea.  On subsequent evaluation, she felt entirely improved after the Zofran and is only had 1 loose bowel movement while here in the ED during her 4-hour duration.  She feels improved and is ready for discharge.  She will use loperamide over-the-counter as needed for diarrhea.  She will maintain isolation precautions for a couple more days before returning to work.  She will check her temperature regularly take Tylenol as needed for fever control.  ED return cautions discussed.  Patient voices understanding and is agreeable to the plan.  Final Clinical Impression(s) / ED Diagnoses Final diagnoses:  Hypokalemia  Viral illness    Rx / DC Orders ED Discharge Orders         Ordered    ondansetron (ZOFRAN ODT) 4  MG disintegrating tablet  Every 8 hours PRN        05/21/20 1726    potassium chloride (KLOR-CON) 10 MEQ tablet  Daily        05/21/20 1726           Lorelee NewGreen, Nahsir Venezia L, PA-C 05/21/20 1731    Horton, Clabe SealKristie M, DO 05/23/20 [phone]

## 2020-05-21 NOTE — ED Triage Notes (Signed)
C/o n/d x 6 days

## 2020-05-29 ENCOUNTER — Other Ambulatory Visit (HOSPITAL_COMMUNITY)
Admission: RE | Admit: 2020-05-29 | Discharge: 2020-05-29 | Disposition: A | Discharge status: Home / Self Care | Payer: Medicaid Other | Source: Ambulatory Visit | Attending: Advanced Practice Midwife | Admitting: Advanced Practice Midwife

## 2020-05-29 ENCOUNTER — Ambulatory Visit (INDEPENDENT_AMBULATORY_CARE_PROVIDER_SITE_OTHER): Payer: Medicaid Other

## 2020-05-29 VITALS — BP 98/58 | HR 91 | Wt 158.0 lb

## 2020-05-29 DIAGNOSIS — N898 Other specified noninflammatory disorders of vagina: Secondary | ICD-10-CM | POA: Insufficient documentation

## 2020-05-29 NOTE — Progress Notes (Addendum)
SUBJECTIVE:  25 y.o. female complains of foul vaginal discharge with odor for 1 week(s). Denies abnormal vaginal bleeding or significant pelvic pain or fever. No UTI symptoms. Denies history of known exposure to STD.  Patient's last menstrual period was 05/19/2020.  OBJECTIVE:  She appears well, afebrile. Urine dipstick: not done.  ASSESSMENT:  Vaginal Discharge  Vaginal Odor   PLAN:   BVAG, CVAG probe sent to lab. Treatment: To be determined once lab results are received ROV prn if symptoms persist or worsen.      Patient was assessed and managed by nursing staff during this encounter. I have reviewed the chart and agree with the documentation and plan. I have also made any necessary editorial changes.  Wynelle Bourgeois, CNM 05/30/2020 1:54 AM

## 2020-05-30 LAB — CERVICOVAGINAL ANCILLARY ONLY
Bacterial Vaginitis (gardnerella): POSITIVE — AB
Candida Glabrata: NEGATIVE
Candida Vaginitis: NEGATIVE
Comment: NEGATIVE
Comment: NEGATIVE
Comment: NEGATIVE

## 2020-05-31 ENCOUNTER — Other Ambulatory Visit: Payer: Self-pay | Admitting: Advanced Practice Midwife

## 2020-05-31 ENCOUNTER — Other Ambulatory Visit: Payer: Self-pay

## 2020-05-31 DIAGNOSIS — N760 Acute vaginitis: Secondary | ICD-10-CM

## 2020-05-31 DIAGNOSIS — B9689 Other specified bacterial agents as the cause of diseases classified elsewhere: Secondary | ICD-10-CM

## 2020-05-31 MED ORDER — METRONIDAZOLE 500 MG PO TABS: 500.0000 mg | ORAL_TABLET | Freq: Two times a day (BID) | ORAL | 0 refills | Status: AC

## 2020-05-31 NOTE — Telephone Encounter (Signed)
error 

## 2020-05-31 NOTE — Progress Notes (Signed)
Seen in office for vaginal discharge with odor  Test results show bacterial vaginosis  Rx sent for Flagyl 500mg  bid x 7d

## 2020-06-15 ENCOUNTER — Encounter: Payer: Self-pay | Admitting: Obstetrics & Gynecology

## 2020-06-15 ENCOUNTER — Ambulatory Visit: Payer: Medicaid Other | Admitting: Obstetrics & Gynecology

## 2020-06-15 ENCOUNTER — Other Ambulatory Visit: Payer: Self-pay

## 2020-06-15 ENCOUNTER — Other Ambulatory Visit (HOSPITAL_COMMUNITY)
Admission: RE | Admit: 2020-06-15 | Discharge: 2020-06-15 | Disposition: A | Discharge status: Home / Self Care | Payer: Medicaid Other | Source: Ambulatory Visit | Attending: Obstetrics & Gynecology | Admitting: Obstetrics & Gynecology

## 2020-06-15 VITALS — BP 107/65 | HR 92 | Ht 64.0 in | Wt 160.0 lb

## 2020-06-15 DIAGNOSIS — Z113 Encounter for screening for infections with a predominantly sexual mode of transmission: Secondary | ICD-10-CM | POA: Insufficient documentation

## 2020-06-15 DIAGNOSIS — Z01419 Encounter for gynecological examination (general) (routine) without abnormal findings: Secondary | ICD-10-CM

## 2020-06-15 DIAGNOSIS — Z0000 Encounter for general adult medical examination without abnormal findings: Secondary | ICD-10-CM | POA: Diagnosis not present

## 2020-06-15 NOTE — Progress Notes (Signed)
Subjective:     [name] is a 25 y.o. female here for a routine exam.  Current complaints: none. Pt requests STI screen.   Had COVID at the beginning of the pandemic. Did not get vaccinated. Son is in school. Good health overall.    Gynecologic History Patient's last menstrual period was 05/19/2020 (exact date). Contraception: same sex partner Last Pap: 11/13/2017. Results were: ASCUS +hrHPV Last mammogram: n/a  Obstetric History OB History  Gravida Para Term Preterm AB Living  2 1 1   1 1   SAB IAB Ectopic Multiple Live Births    1   0 1    # Outcome Date GA Lbr Len/2nd Weight Sex Delivery Anes PTL Lv  2 Term 05/09/16 [redacted]w[redacted]d 09:14 / 01:55 6 lb 14.1 oz (3.121 kg) M Vag-Spont EPI  LIV  1 IAB              The following portions of the patient's history were reviewed and updated as appropriate: allergies, current medications, past family history, past medical history, past social history, past surgical history and problem list.  Review of Systems Pertinent items are noted in HPI.    Objective:   BP 107/65 (BP Location: Right Arm, Patient Position: Sitting, Cuff Size: Normal)   Pulse 92   Ht 5\' 4"  (1.626 m)   Wt 160 lb (72.6 kg)   LMP 05/19/2020 (Exact Date)   BMI 27.46 kg/m  General Appearance:    Alert, cooperative, no distress, appears stated age  Head:    Normocephalic, without obvious abnormality, atraumatic  Eyes:    conjunctiva/corneas clear, EOM's intact, both eyes  Ears:    Normal external ear canals, both ears  Nose:   Nares normal, septum midline, mucosa normal, no drainage    or sinus tenderness  Throat:   Lips, mucosa, and tongue normal; teeth and gums normal  Neck:   Supple, symmetrical, trachea midline, no adenopathy;    thyroid:  no enlargement/tenderness/nodules  Back:     Symmetric, no curvature, ROM normal, no CVA tenderness  Lungs:     respirations unlabored  Chest Wall:    No tenderness or deformity   Heart:    Regular rate and rhythm  Breast Exam:     No tenderness, masses, or nipple abnormality  Abdomen:     Soft, non-tender, bowel sounds active all four quadrants,    no masses, no organomegaly  Genitalia:    Normal female without lesion, discharge or tenderness     Extremities:   Extremities normal, atraumatic, no cyanosis or edema  Pulses:   2+ and symmetric all extremities  Skin:   Skin color, texture, turgor normal, no rashes or lesions    Assessment:    Healthy female exam.   STI screen     Plan:  [name] was seen today for gynecologic exam.  Diagnoses and all orders for this visit:  Well female exam with routine gynecological exam -     Cytology - PAP( [place]) -     Cervicovaginal ancillary only( [place])  Routine screening for STI (sexually transmitted infection) -     Cytology - PAP( [place]) -     Cervicovaginal ancillary only( [place])  f/u in 1 year or sooner prn   Kirsty Monjaraz L. Harraway-Smith, M.D., 

## 2020-06-15 NOTE — Progress Notes (Signed)
Pt presents today for yearly exam. LMP: 05/19/20. BCM: none. Last pap: 09/2017 WNL. Pt has no other complaints.

## 2020-06-18 LAB — CERVICOVAGINAL ANCILLARY ONLY
Bacterial Vaginitis (gardnerella): NEGATIVE
Candida Glabrata: NEGATIVE
Candida Vaginitis: NEGATIVE
Chlamydia: NEGATIVE
Comment: NEGATIVE
Comment: NEGATIVE
Comment: NEGATIVE
Comment: NEGATIVE
Comment: NEGATIVE
Comment: NORMAL
Neisseria Gonorrhea: NEGATIVE
Trichomonas: NEGATIVE

## 2020-06-19 LAB — CYTOLOGY - PAP: Diagnosis: NEGATIVE

## 2020-10-26 IMAGING — US US ART/VEN ABD/PELV/SCROTUM DOPPLER LTD
1 series · 13 of 25 positions shown · non-contrast
Comparison: Pelvic ultrasound dated 01/20/2018.

CLINICAL DATA: 23-year-old female with history of prior right
oophorectomy as well as prior history of ovarian torsion presenting
with intense left pelvic pain.

EXAM:
TRANSABDOMINAL AND TRANSVAGINAL ULTRASOUND OF PELVIS
DOPPLER ULTRASOUND OF OVARIES
TECHNIQUE: Both transabdominal and transvaginal ultrasound examinations of the
pelvis were performed. Transabdominal technique was performed for
global imaging of the pelvis including uterus, ovaries, adnexal
regions, and pelvic cul-de-sac.
It was necessary to proceed with endovaginal exam following the
transabdominal exam to visualize the endometrium and ovaries. Color
and duplex Doppler ultrasound was utilized to evaluate blood flow to
the ovaries.

[Series 1: us art/ven abd/pelv/scrotum doppler ltd · 50 acquisitions, 13 frames shown]
[im 1/50]
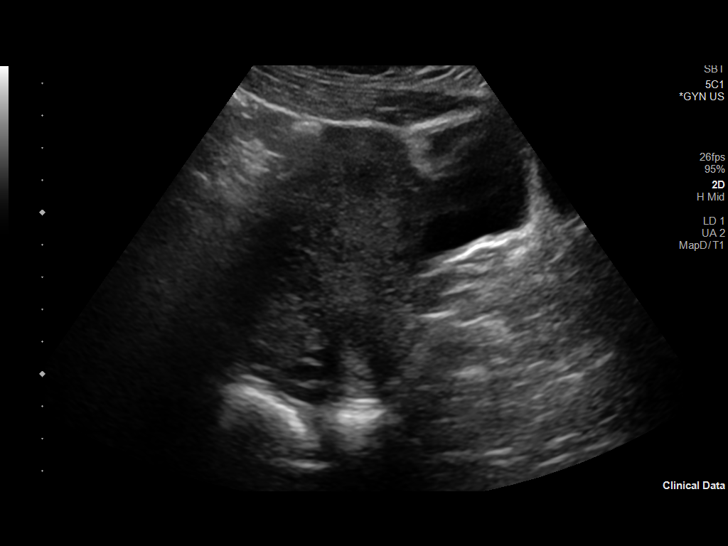
[im 5/50]
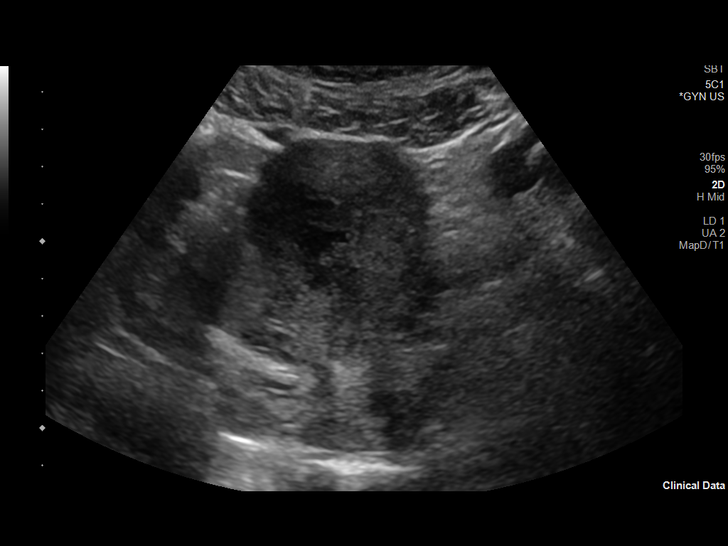
[im 9/50]
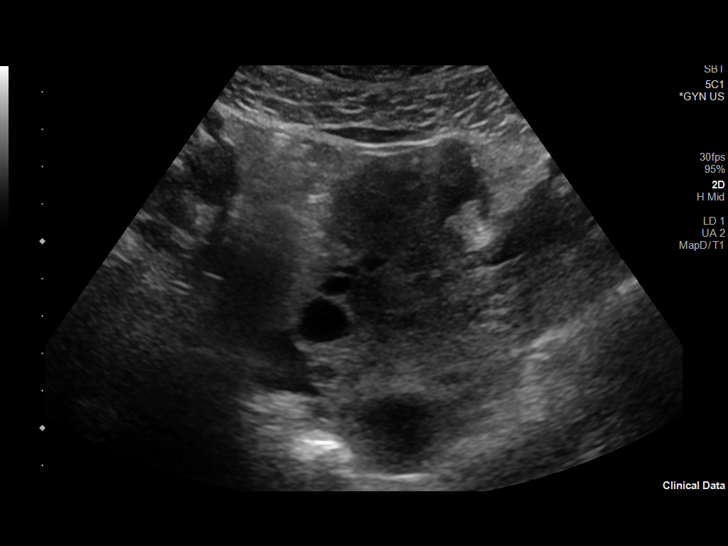
[im 13/50]
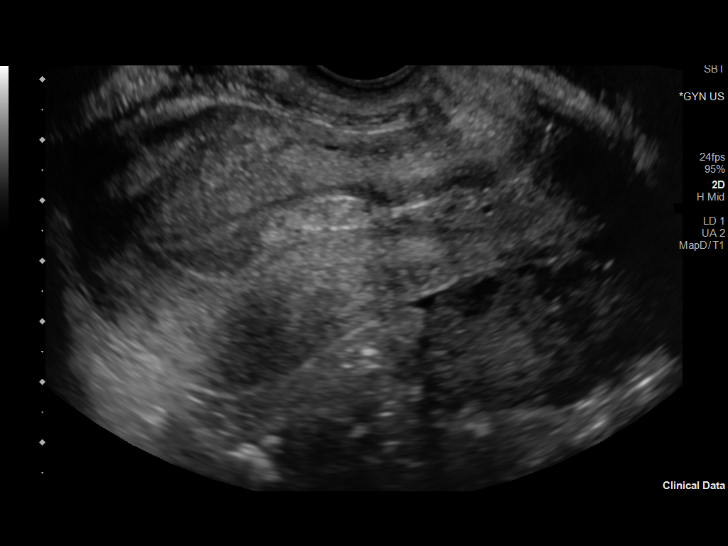
[im 17/50]
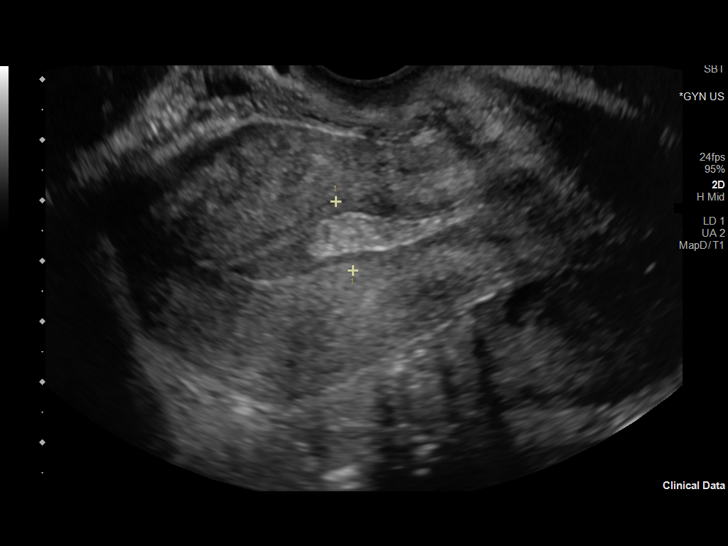
[im 21/50]
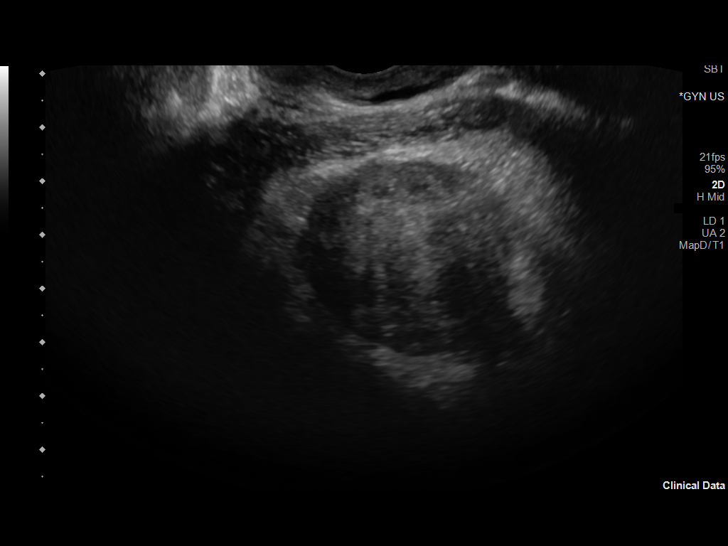
[im 25/50]
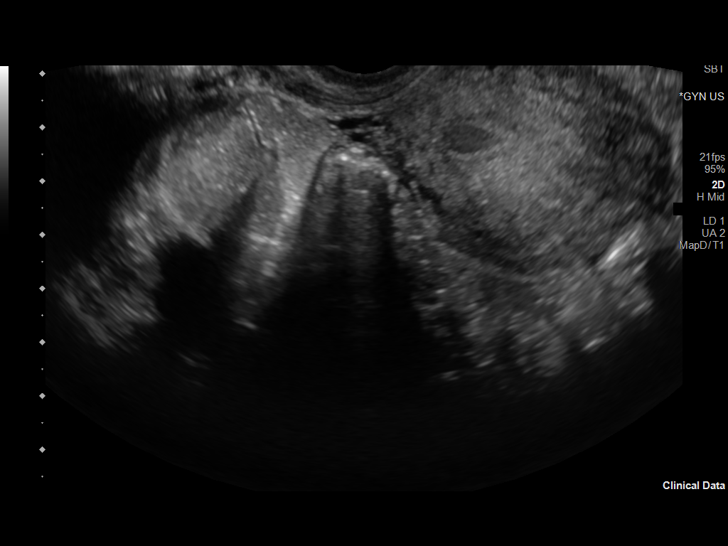
[im 29/50]
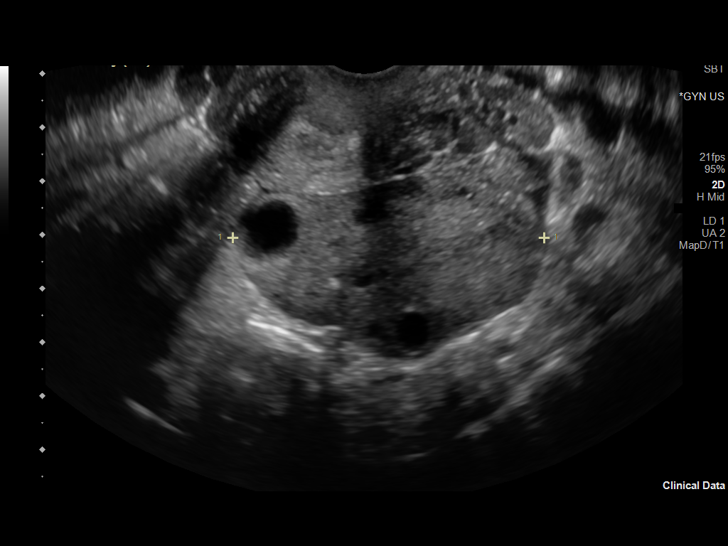
[im 33/50]
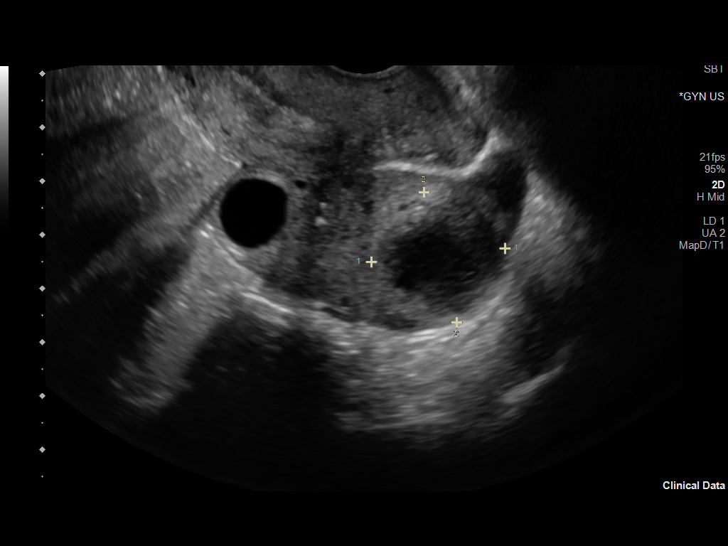
[im 37/50]
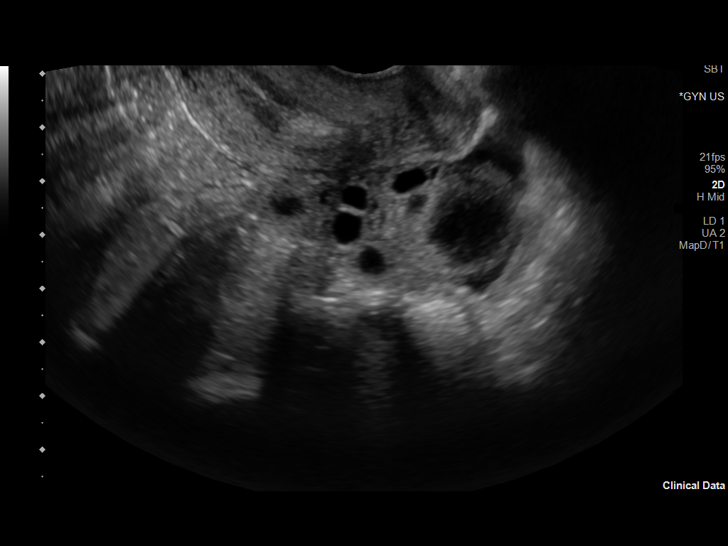
[im 41/50]
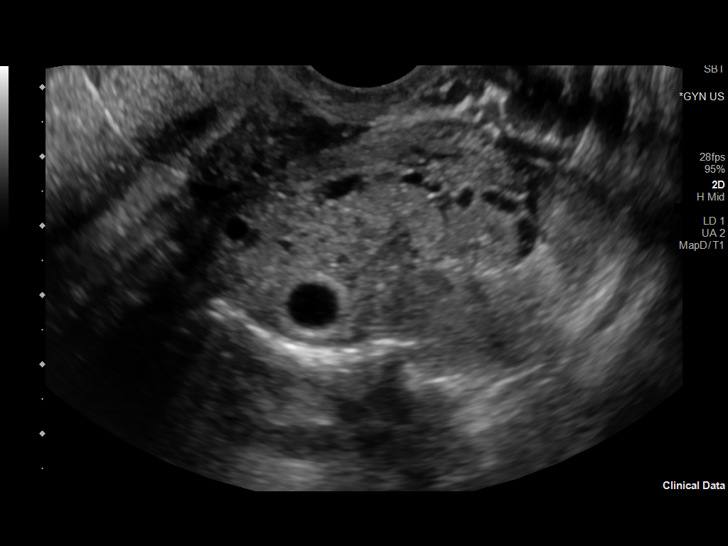
[im 45/50]
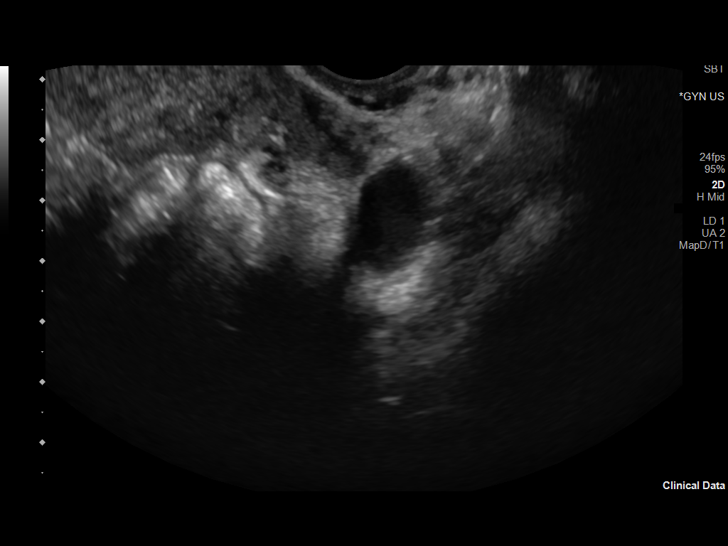
[im 50/50]
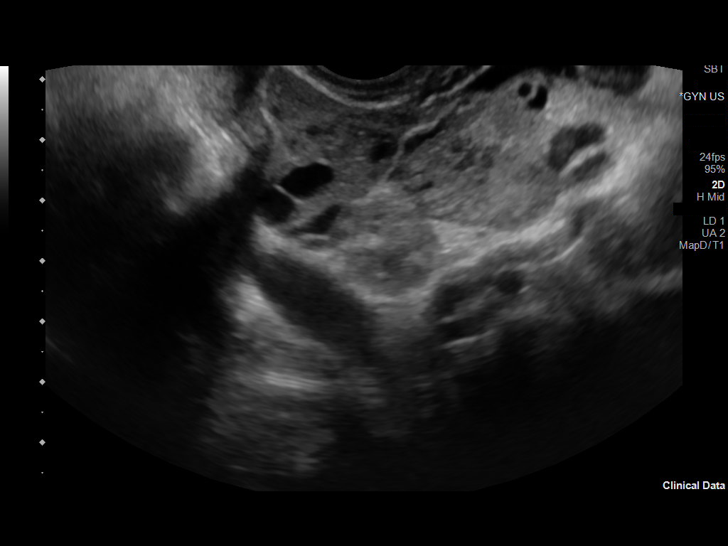

[13 of 25 positions shown; findings below may reference images not displayed]

FINDINGS: Uterus

Measurements: 9.3 x 4.7 x 4.2 cm = volume: 95 mL. The uterus is
anteverted. There is slight heterogeneity of the myometrium with
findings of possible mild adenomyosis.

Endometrium

Thickness: 12 mm. The endometrium is thickened and contains mobile
echogenic content likely blood products/clot.

Right ovary

Oophorectomy.

Left ovary

Measurements: 6.0 x 3.2 x 5.8 cm = volume: 58 mL. The left ovary is
enlarged with increased echogenicity and peripherally displaced
follicles. There is a 2.5 x 2.5 x 2.5 cm hemorrhagic cyst in the
left ovary.

There is almost 360 degree twisting of the pedicle of the left ovary
highly concerning for a degree of torsion or intermittent torsion.

Pulsed Doppler evaluation of both ovaries demonstrates normal
low-resistance arterial and venous waveforms.

Other findings

There is small amount of simple appearing fluid within the pelvis.
IMPRESSION: 1. Enlarged left ovary with sonographic findings concerning for
torsion. Clinical correlation and gynecology consult is advised.
Doppler images demonstrate some preserved arterial and venous flow
within the ovary.
2. Probable blood clot within the endometrium.

These results were called by telephone at the time of interpretation
on 04/13/2019 at [DATE] to provider HJALTE DOLLERUP , who verbally
acknowledged these results.

## 2020-11-02 IMAGING — US US PELVIS COMPLETE TRANSABD/TRANSVAG W DUPLEX
1 series · 13 of 25 positions shown · non-contrast
Comparison: Prior ultrasound from 04/13/2019

CLINICAL DATA: Initial evaluation for persistent left adnexal pain.
History of prior right oophorectomy. Possible left sided torsion on
recent ultrasound.

EXAM:
TRANSABDOMINAL AND TRANSVAGINAL ULTRASOUND OF PELVIS
DOPPLER ULTRASOUND OF OVARIES
TECHNIQUE: Both transabdominal and transvaginal ultrasound examinations of the
pelvis were performed. Transabdominal technique was performed for
global imaging of the pelvis including uterus, ovaries, adnexal
regions, and pelvic cul-de-sac.
It was necessary to proceed with endovaginal exam following the
transabdominal exam to visualize the uterus, endometrium, and left
ovary. Color and duplex Doppler ultrasound was utilized to evaluate
blood flow to the ovaries.

[Series 1: us pelvis complete transabd/transvag w duplex · 51 acquisitions, 13 frames shown]
[im 1/51]
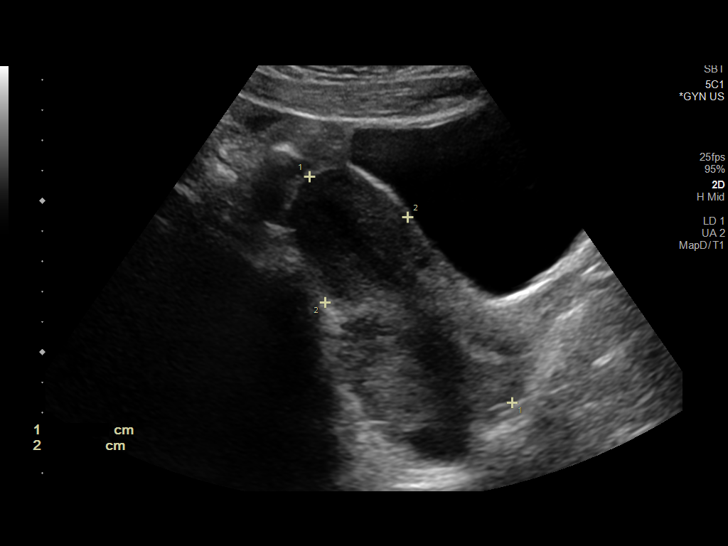
[im 5/51]
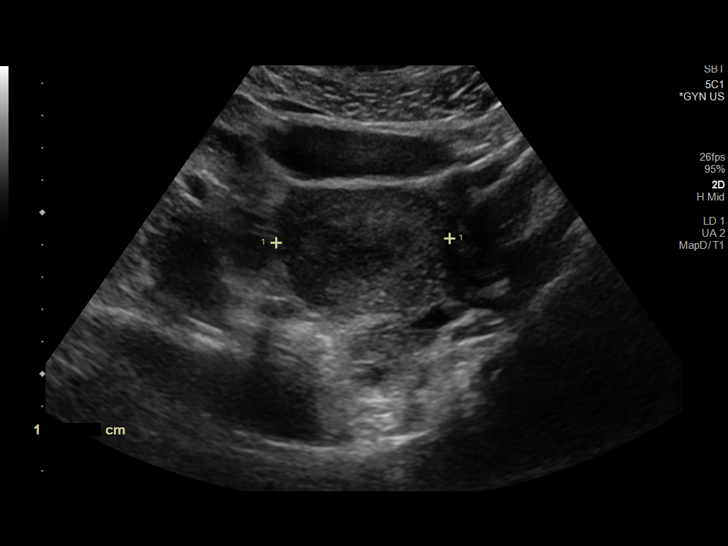
[im 9/51]
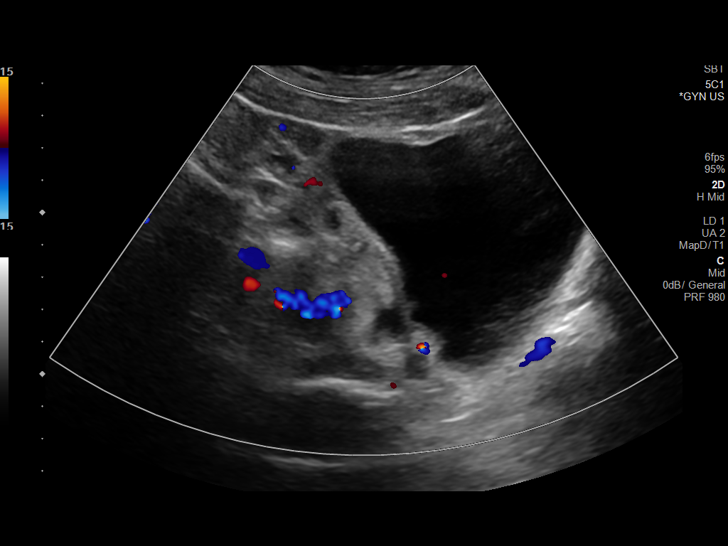
[im 13/51]
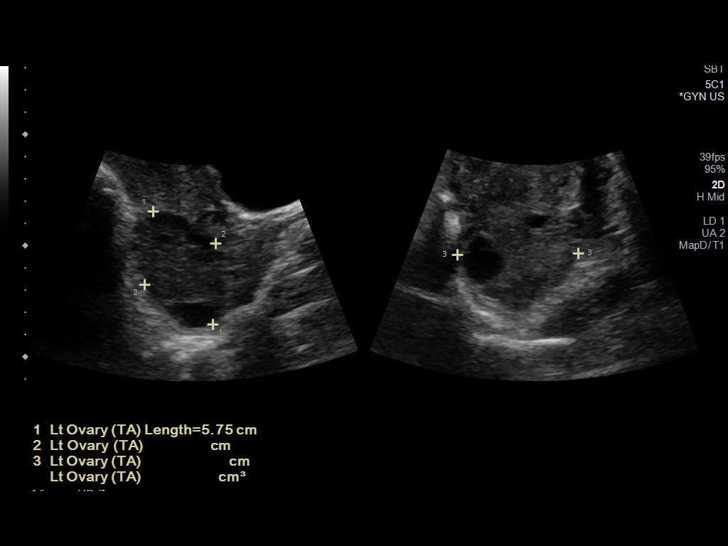
[im 17/51]
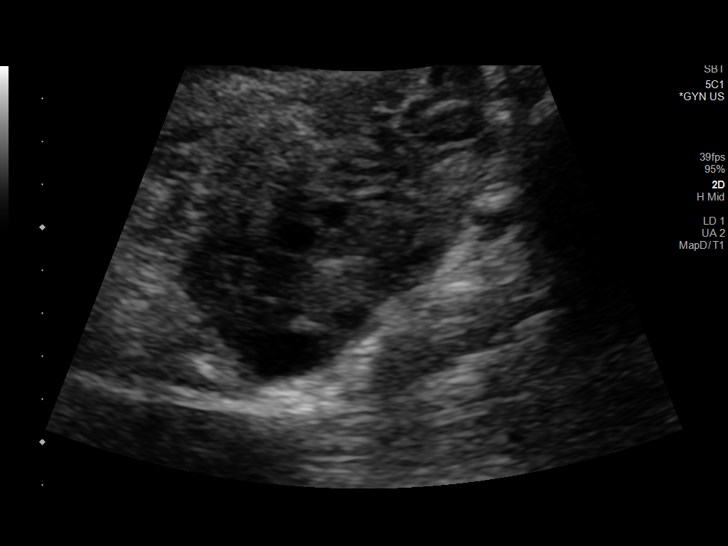
[im 21/51]
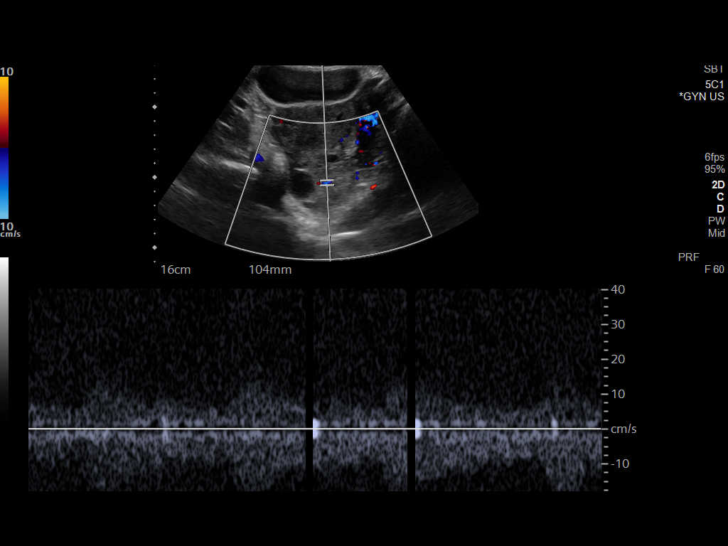
[im 26/51]
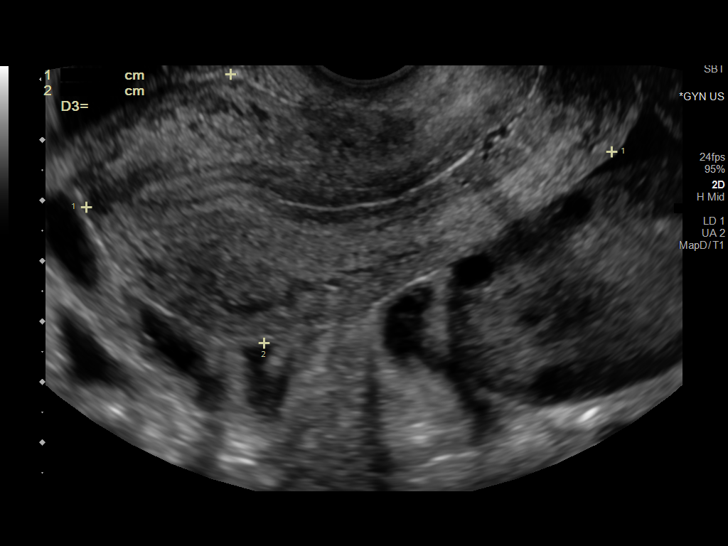
[im 30/51]
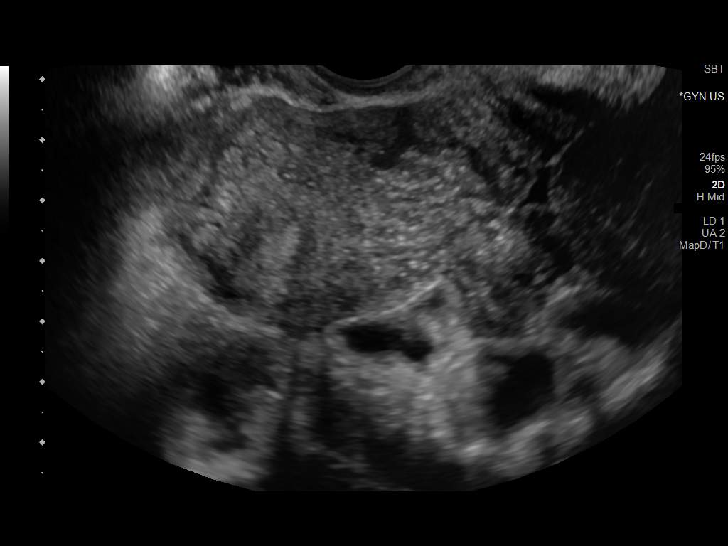
[im 34/51]
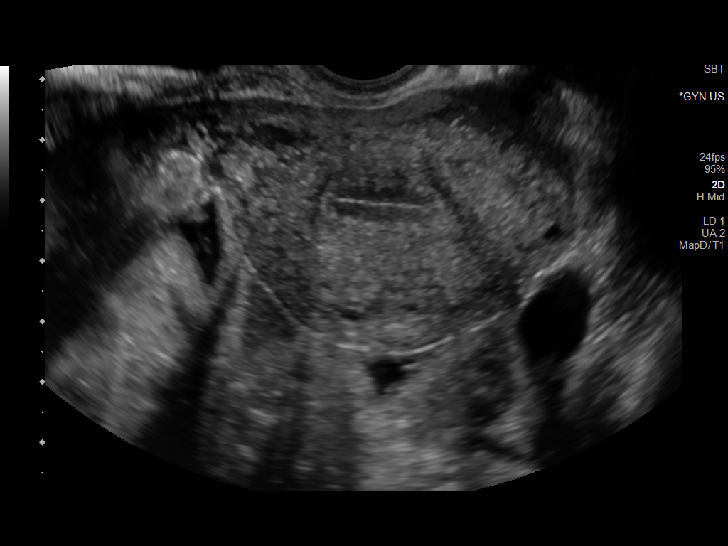
[im 38/51]
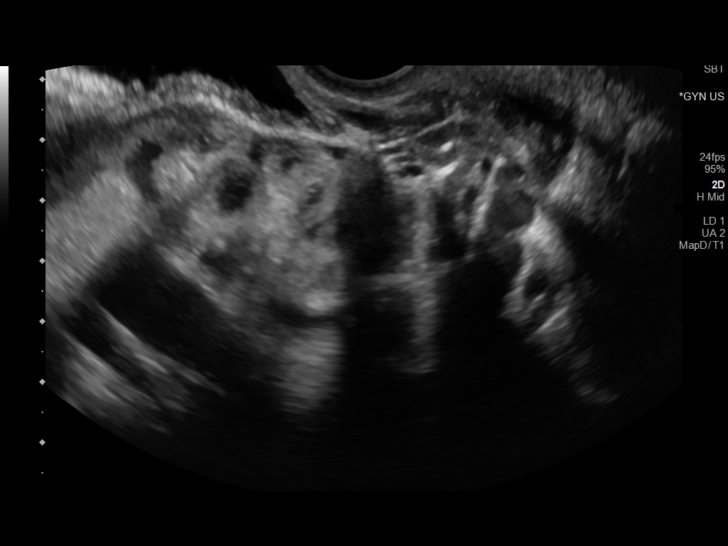
[im 42/51]
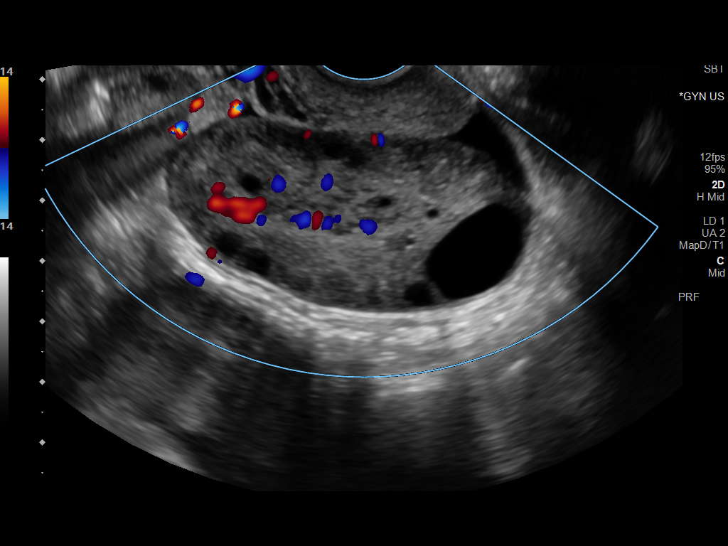
[im 46/51]
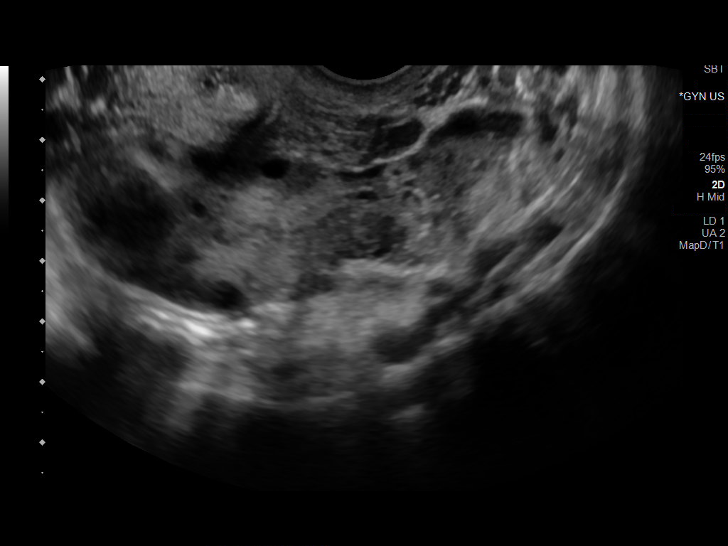
[im 51/51]
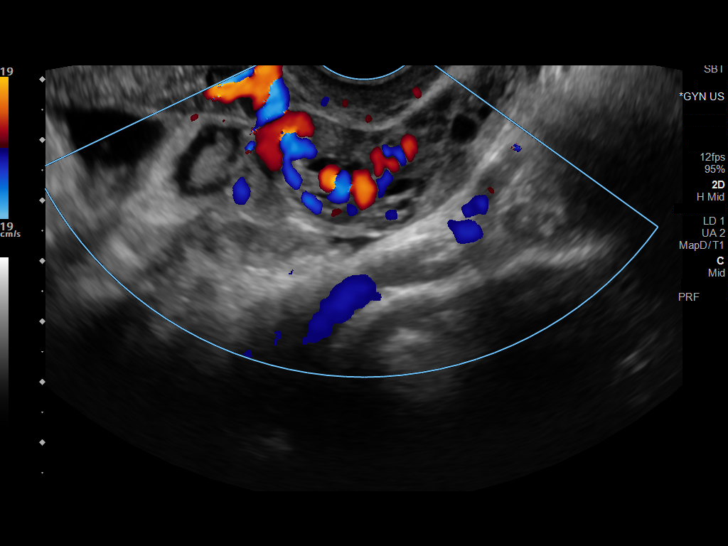

[13 of 25 positions shown; findings below may reference images not displayed]

FINDINGS: Uterus

Measurements: 8.7 x 4.5 x 5.5 cm = volume: 114 mL. No fibroids or
other mass visualized.

Endometrium

Thickness: 9 mm.  No focal abnormality visualized.

Right ovary

Prior right oophorectomy.  No adnexal mass.

Left ovary

Measurements: 6.2 x 2.9 x 5.6 cm = volume: 54 mL. Left ovary again
appears somewhat enlarged with increased echogenicity and
peripherally distributed follicles. 2.2 x 1.8 x 1.9 cm complex cyst
with low level internal echoes and probable internal fluid-fluid
level, relatively unchanged from previous.

Pulsed Doppler evaluation of the left ovary demonstrates normal
low-resistance arterial and venous waveforms. On cine imaging, again
seen is twisting and somewhat corkscrew appearance of the vascular
pedicle, similar to previous exam, and suspected to possibly reflect
a chronic finding. There appears to be preserved flow within the
torsed vessels.

Other findings

Small volume free fluid present within the pelvis.
IMPRESSION: 1. Relatively stable appearance of the left ovary as compared to
04/13/2019. Again seen is twisting of the vascular pedicle with
preserved Doppler arterial and venous waveforms within the left
ovary itself, suggesting that this may be a chronic and compensated
finding. No evidence for overt torsion on today's exam.
2. Similar 2.2 cm complex left ovarian cyst, which could reflect a
small hemorrhagic cyst or endometrioma.

## 2020-12-27 DIAGNOSIS — Z419 Encounter for procedure for purposes other than remedying health state, unspecified: Secondary | ICD-10-CM | POA: Diagnosis not present

## 2021-01-27 DIAGNOSIS — Z419 Encounter for procedure for purposes other than remedying health state, unspecified: Secondary | ICD-10-CM | POA: Diagnosis not present

## 2021-02-15 ENCOUNTER — Encounter (HOSPITAL_BASED_OUTPATIENT_CLINIC_OR_DEPARTMENT_OTHER): Payer: Self-pay | Admitting: *Deleted

## 2021-02-15 ENCOUNTER — Other Ambulatory Visit: Payer: Self-pay

## 2021-02-15 ENCOUNTER — Emergency Department (HOSPITAL_BASED_OUTPATIENT_CLINIC_OR_DEPARTMENT_OTHER)
Admission: EM | Admit: 2021-02-15 | Discharge: 2021-02-15 | Disposition: A | Discharge status: Home / Self Care | Payer: Medicaid Other | Attending: Emergency Medicine | Admitting: Emergency Medicine

## 2021-02-15 ENCOUNTER — Ambulatory Visit: Payer: Self-pay

## 2021-02-15 DIAGNOSIS — L02415 Cutaneous abscess of right lower limb: Secondary | ICD-10-CM | POA: Insufficient documentation

## 2021-02-15 DIAGNOSIS — R110 Nausea: Secondary | ICD-10-CM | POA: Diagnosis not present

## 2021-02-15 DIAGNOSIS — Z91040 Latex allergy status: Secondary | ICD-10-CM | POA: Diagnosis not present

## 2021-02-15 DIAGNOSIS — L0291 Cutaneous abscess, unspecified: Secondary | ICD-10-CM

## 2021-02-15 MED ORDER — LIDOCAINE-EPINEPHRINE (PF) 2 %-1:200000 IJ SOLN
10.0000 mL | Freq: Once | INTRAMUSCULAR | Status: AC
  Administered 2021-02-15: 10 mL
  Filled 2021-02-15: qty 20

## 2021-02-15 MED ORDER — DOXYCYCLINE HYCLATE 100 MG PO CAPS: 100.0000 mg | ORAL_CAPSULE | Freq: Two times a day (BID) | ORAL | 0 refills | Status: AC

## 2021-02-15 NOTE — Discharge Instructions (Addendum)
Please come back in 2 to 3 days to get the area rechecked.  You may have the packing removed at that time.

## 2021-02-15 NOTE — ED Triage Notes (Signed)
Abscess to her right inner thigh since shaving yesterday.

## 2021-02-15 NOTE — ED Provider Notes (Signed)
[facility] EMERGENCY DEPARTMENT Provider Note   CSN: [id number] Arrival date & time: 02/15/21  1358     History  Chief Complaint  Patient presents with   Abscess    [name] is a 26 y.o. female presenting with a complaint of an abscess to her right middle thigh.  Reports that she shaved her bikini area and yesterday she realized there was an abscess that was causing her a large amount of pain.  Today the pain got worse and made her nauseous so she decided to come to the emergency department.  No history of recurrent abscesses, diabetes or hidradenitis suppurativa.    Abscess Associated symptoms: nausea   Associated symptoms: no fever       Home Medications Prior to Admission medications   Not on File      Allergies    Latex    Review of Systems   Review of Systems  Constitutional:  Negative for chills and fever.  Gastrointestinal:  Positive for nausea.  Skin:  Positive for wound.  Neurological:  Positive for dizziness.   Physical Exam Updated Vital Signs BP 109/75 (BP Location: Right Arm)    Pulse (!) 113    Temp 99.8 F (37.7 C) (Oral)    Resp 18    Ht 5\' 4"  (1.626 m)    Wt 72.6 kg    LMP 01/12/2021    SpO2 98%    BMI 27.47 kg/m  Physical Exam Vitals and nursing note reviewed.  Constitutional:      Appearance: Normal appearance.  HENT:     Head: Normocephalic and atraumatic.  Eyes:     General: No scleral icterus.    Conjunctiva/sclera: Conjunctivae normal.  Pulmonary:     Effort: Pulmonary effort is normal. No respiratory distress.  Skin:    General: Skin is warm and dry.     Findings: No rash.     Comments: Quarter size abscess to right thigh, just below the inguinal fold.  Erythematous surrounding skin  Neurological:     Mental Status: She is alert.  Psychiatric:        Mood and Affect: Mood normal.    ED Results / Procedures / Treatments   Labs (all labs ordered are listed, but only abnormal results are displayed) Labs Reviewed  - No data to display  EKG None  Radiology No results found.  Procedures .12/19/2022Incision and Drainage  Date/Time: 02/15/2021 3:26 PM Performed by: 02/17/2021, PA-C Authorized by: Saddie Benders, PA-C   Consent:    Consent obtained:  Verbal   Consent given by:  Patient   Risks discussed:  Incomplete drainage, pain and infection Location:    Type:  Abscess   Size:  1.5 cm   Location:  Lower extremity   Lower extremity location:  Leg   Leg location:  R upper leg Pre-procedure details:    Skin preparation:  Chlorhexidine with alcohol Sedation:    Sedation type:  None Anesthesia:    Anesthesia method:  Local infiltration   Local anesthetic:  Lidocaine 2% WITH epi Procedure type:    Complexity:  Simple Procedure details:    Incision types:  Single straight   Wound management:  Probed and deloculated and irrigated with saline   Drainage:  Purulent   Drainage amount:  Moderate   Wound treatment:  Wound left open   Packing materials:  1/4 in iodoform gauze   Amount 1/4" iodoform:  2in Post-procedure details:  Procedure completion:  Tolerated well, no immediate complications    Medications Ordered in ED Medications  lidocaine-EPINEPHrine (XYLOCAINE W/EPI) 2 %-1:200000 (PF) injection 10 mL (has no administration in time range)    ED Course/ Medical Decision Making/ A&P                           Medical Decision Making Risk Prescription drug management.   26 year old female presenting with an abscess to the right groin.  Does not have a history of these. There was a clear area of fluctuance, and no draining noted.  I&D was performed and tolerated well.  The wound was packed and she was discharged with doxycycline.  She will return in 48 to 72 hours to have the area rechecked and packing removed  Final Clinical Impression(s) / ED Diagnoses Final diagnoses:  Abscess    Rx / DC Orders Results and diagnoses were explained to the patient. Return precautions  discussed in full. Patient had no additional questions and expressed complete understanding.   This chart was dictated using voice recognition software.  Despite best efforts to proofread,  errors can occur which can change the documentation meaning.     Woodroe Chen 02/15/21 1527    Terrilee Files, MD 02/15/21 1944

## 2021-02-15 NOTE — ED Notes (Signed)
Dressing placed on right inner upper thigh abscess. Pt educated on how to perform dressing changes at home and how to clean the area. Pt verbalized understanding. All questions answered at time of discharge. Pt ambulatory to ED exit.

## 2021-02-15 NOTE — Telephone Encounter (Signed)
° ° °  Chief Complaint: Boil to right inner thigh at groin Symptoms: Painful, red Frequency: Started 2 days ago Pertinent Negatives: Patient denies fever Disposition: [] ED /[x] Urgent Care (no appt availability in office) / [] Appointment(In office/virtual)/ []  [place] [facility]/ [] Home Care/ [] Refused Recommended Disposition /[] [facility]/ []  Follow-up with PCP Additional Notes:    Reason for Disposition  SEVERE pain (e.g., excruciating)  Answer Assessment - Initial Assessment Questions 1. APPEARANCE of BOIL: "What does the boil look like?"      Raised, red 2. LOCATION: "Where is the boil located?"      Groin 3. NUMBER: "How many boils are there?"      1 4. SIZE: "How big is the boil?" (e.g., inches, cm; compare to size of a coin or other object)     1 inch 5. ONSET: "When did the boil start?"     2 days ago 6. PAIN: "Is there any pain?" If Yes, ask: "How bad is the pain?"   (Scale 1-10; or mild, moderate, severe)     8 7. FEVER: "Do you have a fever?" If Yes, ask: "What is it, how was it measured, and when did it start?"      No 8. SOURCE: "Have you been around anyone with boils or other Staph infections?" "Have you ever had boils before?"     Unsure 9. OTHER SYMPTOMS: "Do you have any other symptoms?" (e.g., shaking chills, weakness, rash elsewhere on body)     Weak 10. PREGNANCY: "Is there any chance you are pregnant?" "When was your last menstrual period?"       No  Protocols used: Boil (Skin Abscess)-A-AH

## 2021-02-18 ENCOUNTER — Telehealth: Payer: Self-pay

## 2021-02-18 ENCOUNTER — Emergency Department (HOSPITAL_BASED_OUTPATIENT_CLINIC_OR_DEPARTMENT_OTHER)
Admission: EM | Admit: 2021-02-18 | Discharge: 2021-02-18 | Disposition: A | Discharge status: Home / Self Care | Payer: Medicaid Other | Attending: Emergency Medicine | Admitting: Emergency Medicine

## 2021-02-18 ENCOUNTER — Encounter (HOSPITAL_BASED_OUTPATIENT_CLINIC_OR_DEPARTMENT_OTHER): Payer: Self-pay | Admitting: *Deleted

## 2021-02-18 ENCOUNTER — Other Ambulatory Visit: Payer: Self-pay

## 2021-02-18 DIAGNOSIS — Z4801 Encounter for change or removal of surgical wound dressing: Secondary | ICD-10-CM | POA: Diagnosis not present

## 2021-02-18 DIAGNOSIS — Z91040 Latex allergy status: Secondary | ICD-10-CM | POA: Insufficient documentation

## 2021-02-18 DIAGNOSIS — X58XXXA Exposure to other specified factors, initial encounter: Secondary | ICD-10-CM | POA: Insufficient documentation

## 2021-02-18 DIAGNOSIS — Z4800 Encounter for change or removal of nonsurgical wound dressing: Secondary | ICD-10-CM

## 2021-02-18 DIAGNOSIS — S80851A Superficial foreign body, right lower leg, initial encounter: Secondary | ICD-10-CM | POA: Insufficient documentation

## 2021-02-18 DIAGNOSIS — Z5189 Encounter for other specified aftercare: Secondary | ICD-10-CM

## 2021-02-18 MED ORDER — FLUCONAZOLE 150 MG PO TABS: 150.0000 mg | ORAL_TABLET | Freq: Every day | ORAL | 0 refills | Status: AC

## 2021-02-18 MED ORDER — DOXYCYCLINE HYCLATE 100 MG PO CAPS: 100.0000 mg | ORAL_CAPSULE | Freq: Two times a day (BID) | ORAL | 0 refills | Status: DC

## 2021-02-18 NOTE — ED Notes (Signed)
Pt. Was seen by EDP for wound recheck and abx therapy.  Pt. Discharged by RN and given all instructions.  Pt. In no distress.  Discharge instructions and understanding given to Pt. And Pt. Verbalized understanding.

## 2021-02-18 NOTE — Telephone Encounter (Signed)
Transition Care Management Follow-up Telephone Call Date of discharge and from where: 02/15/2021 from [facility] How have you been since you were released from the hospital? Pt stated that she is feeling better.  Any questions or concerns? No  Items Reviewed: Did the pt receive and understand the discharge instructions provided? Yes  Medications obtained and verified? Yes  Other? No  Any new allergies since your discharge? No  Dietary orders reviewed? No Do you have support at home? Yes   Functional Questionnaire: (I = Independent and D = Dependent) ADLs: I  Bathing/Dressing- I  Meal Prep- I  Eating- I  Maintaining continence- I  Transferring/Ambulation- I  Managing Meds- I   Follow up appointments reviewed:  PCP Hospital f/u appt confirmed? No   Specialist Hospital f/u appt confirmed? No   Are transportation arrangements needed? No  If their condition worsens, is the pt aware to call PCP or go to the Emergency Dept.? Yes Was the patient provided with contact information for the PCP's office or ED? Yes Was to pt encouraged to call back with questions or concerns? Yes

## 2021-02-18 NOTE — ED Triage Notes (Signed)
States she is here to have her packing removed from an abscess to her right thigh.

## 2021-02-18 NOTE — Discharge Instructions (Signed)
Continue taking antibiotics as prescribed. Take the Diflucan pill after finishing the doxycycline if you are having symptoms of yeast infection. Continue with warm compresses and daily cleaning. Return to the emergency room if you develop fevers, increasing pain or drainage, or any new, worsening, or concerning symptoms

## 2021-02-18 NOTE — ED Provider Notes (Signed)
[facility] EMERGENCY DEPARTMENT Provider Note   CSN: [id number] Arrival date & time: 02/18/21  1239     History  Chief Complaint  Patient presents with   Wound Check    [name] is a 26 y.o. female presenting for evaluation of wound check and packing removal.  Patient states she had an I&D performed few days ago.  She reports since the I&D, pain and drainage has improved.  She is not having any fevers.  Taking antibiotics as prescribed, although she accidentally took 2 doses this morning.  HPI     Home Medications Prior to Admission medications   Medication Sig Start Date End Date Taking? Authorizing Provider  doxycycline (VIBRAMYCIN) 100 MG capsule Take 1 capsule (100 mg total) by mouth 2 (two) times daily. 02/18/21  Yes Doloros Kwolek, PA-C  fluconazole (DIFLUCAN) 150 MG tablet Take 1 tablet (150 mg total) by mouth daily for 1 day. 02/18/21 02/19/21 Yes Binnie Droessler, PA-C  doxycycline (VIBRAMYCIN) 100 MG capsule Take 1 capsule (100 mg total) by mouth 2 (two) times daily for 7 days. 02/15/21 02/22/21  Redwine, Madison A, PA-C      Allergies    Latex    Review of Systems   Review of Systems  Constitutional:  Negative for fever.  Skin:  Positive for wound.   Physical Exam Updated Vital Signs BP 111/73 (BP Location: Right Arm)    Pulse 87    Temp 98.1 F (36.7 C) (Oral)    Resp 18    Ht 5\' 4"  (1.626 m)    Wt 72.6 kg    LMP 02/15/2021    SpO2 99%    BMI 27.47 kg/m  Physical Exam Vitals and nursing note reviewed.  Constitutional:      General: She is not in acute distress.    Appearance: She is well-developed.  HENT:     Head: Normocephalic and atraumatic.  Eyes:     Extraocular Movements: Extraocular movements intact.  Cardiovascular:     Rate and Rhythm: Normal rate.  Pulmonary:     Effort: Pulmonary effort is normal.  Abdominal:     General: There is no distension.  Genitourinary:      Comments: Site of I&D with packing present.  No skin  induration or erythema.  No significant tenderness.  No drainage. Musculoskeletal:        General: Normal range of motion.     Cervical back: Normal range of motion.  Skin:    General: Skin is warm.     Findings: No rash.  Neurological:     Mental Status: She is alert and oriented to person, place, and time.    ED Results / Procedures / Treatments   Labs (all labs ordered are listed, but only abnormal results are displayed) Labs Reviewed - No data to display  EKG None  Radiology No results found.  Procedures .Foreign Body Removal  Date/Time: 02/18/2021 2:49 PM Performed by: Franchot Heidelberg, PA-C Authorized by: Franchot Heidelberg, PA-C  Consent: Verbal consent obtained. Consent given by: patient Intake: R medial upper leg. Anesthesia method: none.  Sedation: Patient sedated: no  Patient restrained: no Complexity: simple 1 objects recovered. Objects recovered: packing materia Post-procedure assessment: foreign body removed     Medications Ordered in ED Medications - No data to display  ED Course/ Medical Decision Making/ A&P  Medical Decision Making   This patient presents to the ED for concern of wound recheck and packing removal.    Additional history: Reviewed recent ER note in which packing was placed during I&D.  Disposition:  After consideration of the diagnostic results and the patients response to treatment, I feel that the patent would benefit from outpatient management, continuing antibiotics.  Discussed continued wound care.  Document removal as described above, patient tolerated well.  There is no drainage at this time.  No signs of worsening infection, in fact per patient history it is improving.  At this time, patient appears safe for discharge.  Return precautions given.  Patient states she understands and agrees to plan   Final Clinical Impression(s) / ED Diagnoses Final diagnoses:  Encounter for abscess packing  removal  Visit for wound check    Rx / DC Orders ED Discharge Orders          Ordered    doxycycline (VIBRAMYCIN) 100 MG capsule  2 times daily        02/18/21 1418    fluconazole (DIFLUCAN) 150 MG tablet  Daily        02/18/21 1418              Ferris, Jamacia Jester, PA-C 02/18/21 1450    Isla Pence, MD 02/18/21 1540

## 2021-02-19 ENCOUNTER — Telehealth: Payer: Self-pay

## 2021-02-19 NOTE — Telephone Encounter (Signed)
Transition Care Management Follow-up Telephone Call Date of discharge and from where: 02/18/2021 from [facility] How have you been since you were released from the hospital? Pt stated that she is feeling better and did not have any questions or concerns at this time.  Any questions or concerns? No  Items Reviewed: Did the pt receive and understand the discharge instructions provided? Yes  Medications obtained and verified? Yes  Other? No  Any new allergies since your discharge? No  Dietary orders reviewed? No Do you have support at home? Yes   Functional Questionnaire: (I = Independent and D = Dependent) ADLs: I  Bathing/Dressing- I  Meal Prep- I  Eating- I  Maintaining continence- I  Transferring/Ambulation- I  Managing Meds- I   Follow up appointments reviewed:  PCP Hospital f/u appt confirmed? No  Pt stated that she is interested in establishing with a PCP. Information for LBSW given.  [facility] f/u appt confirmed? No  Are transportation arrangements needed? No  If their condition worsens, is the pt aware to call PCP or go to the Emergency Dept.? Yes Was the patient provided with contact information for the PCP's office or ED? Yes Was to pt encouraged to call back with questions or concerns? Yes

## 2021-02-27 DIAGNOSIS — Z419 Encounter for procedure for purposes other than remedying health state, unspecified: Secondary | ICD-10-CM | POA: Diagnosis not present

## 2021-03-27 DIAGNOSIS — Z419 Encounter for procedure for purposes other than remedying health state, unspecified: Secondary | ICD-10-CM | POA: Diagnosis not present

## 2021-04-18 ENCOUNTER — Encounter: Payer: Self-pay | Admitting: General Practice

## 2021-04-27 DIAGNOSIS — Z419 Encounter for procedure for purposes other than remedying health state, unspecified: Secondary | ICD-10-CM | POA: Diagnosis not present

## 2021-05-01 ENCOUNTER — Other Ambulatory Visit: Payer: Self-pay

## 2021-05-01 ENCOUNTER — Emergency Department (HOSPITAL_BASED_OUTPATIENT_CLINIC_OR_DEPARTMENT_OTHER)
Admission: EM | Admit: 2021-05-01 | Discharge: 2021-05-01 | Disposition: A | Discharge status: Home / Self Care | Payer: Medicaid Other | Attending: Emergency Medicine | Admitting: Emergency Medicine

## 2021-05-01 ENCOUNTER — Encounter (HOSPITAL_BASED_OUTPATIENT_CLINIC_OR_DEPARTMENT_OTHER): Payer: Self-pay | Admitting: Emergency Medicine

## 2021-05-01 ENCOUNTER — Emergency Department (HOSPITAL_BASED_OUTPATIENT_CLINIC_OR_DEPARTMENT_OTHER): Payer: Medicaid Other

## 2021-05-01 DIAGNOSIS — Z91040 Latex allergy status: Secondary | ICD-10-CM | POA: Diagnosis not present

## 2021-05-01 DIAGNOSIS — R109 Unspecified abdominal pain: Secondary | ICD-10-CM | POA: Diagnosis present

## 2021-05-01 DIAGNOSIS — N83292 Other ovarian cyst, left side: Secondary | ICD-10-CM | POA: Diagnosis not present

## 2021-05-01 DIAGNOSIS — N83202 Unspecified ovarian cyst, left side: Secondary | ICD-10-CM | POA: Diagnosis not present

## 2021-05-01 DIAGNOSIS — Z90722 Acquired absence of ovaries, bilateral: Secondary | ICD-10-CM | POA: Diagnosis not present

## 2021-05-01 DIAGNOSIS — R1032 Left lower quadrant pain: Secondary | ICD-10-CM | POA: Diagnosis not present

## 2021-05-01 LAB — COMPREHENSIVE METABOLIC PANEL
ALT: 21 U/L (ref 0–44)
AST: 36 U/L (ref 15–41)
Albumin: 4 g/dL (ref 3.5–5.0)
Alkaline Phosphatase: 87 U/L (ref 38–126)
Anion gap: 7 (ref 5–15)
BUN: 10 mg/dL (ref 6–20)
CO2: 21 mmol/L — ABNORMAL LOW (ref 22–32)
Calcium: 8.9 mg/dL (ref 8.9–10.3)
Chloride: 108 mmol/L (ref 98–111)
Creatinine, Ser: 0.64 mg/dL (ref 0.44–1.00)
GFR, Estimated: >60 mL/min (ref >60)
Glucose, Bld: 93 mg/dL (ref 70–99)
Potassium: 4.2 mmol/L (ref 3.5–5.1)
Sodium: 136 mmol/L (ref 135–145)
Total Bilirubin: 0.9 mg/dL (ref 0.3–1.2)
Total Protein: 7.2 g/dL (ref 6.5–8.1)

## 2021-05-01 LAB — CBC
HCT: 34 % — ABNORMAL LOW (ref 36.0–46.0)
Hemoglobin: 10 g/dL — ABNORMAL LOW (ref 12.0–15.0)
MCH: 21.8 pg — ABNORMAL LOW (ref 26.0–34.0)
MCHC: 29.4 g/dL — ABNORMAL LOW (ref 30.0–36.0)
MCV: 74.2 fL — ABNORMAL LOW (ref 80.0–100.0)
Platelets: 232 10*3/uL (ref 150–400)
RBC: 4.58 MIL/uL (ref 3.87–5.11)
RDW: 18 % — ABNORMAL HIGH (ref 11.5–15.5)
WBC: 7.8 10*3/uL (ref 4.0–10.5)
nRBC: 0 % (ref 0.0–0.2)

## 2021-05-01 LAB — URINALYSIS, ROUTINE W REFLEX MICROSCOPIC
Bilirubin Urine: NEGATIVE
Glucose, UA: NEGATIVE mg/dL
Hgb urine dipstick: NEGATIVE
Ketones, ur: 40 mg/dL — AB
Leukocytes,Ua: NEGATIVE
Nitrite: NEGATIVE
Protein, ur: NEGATIVE mg/dL
Specific Gravity, Urine: >1.03 — ABNORMAL HIGH (ref 1.005–1.030)
pH: 5.5 (ref 5.0–8.0)

## 2021-05-01 LAB — LIPASE, BLOOD: Lipase: 36 U/L (ref 11–51)

## 2021-05-01 LAB — PREGNANCY, URINE: Preg Test, Ur: NEGATIVE

## 2021-05-01 MED ORDER — MORPHINE SULFATE (PF) 4 MG/ML IV SOLN
4.0000 mg | Freq: Once | INTRAVENOUS | Status: AC
  Administered 2021-05-01: 4 mg via INTRAVENOUS
  Filled 2021-05-01: qty 1

## 2021-05-01 MED ORDER — KETOROLAC TROMETHAMINE 15 MG/ML IJ SOLN
15.0000 mg | Freq: Once | INTRAMUSCULAR | Status: AC
  Administered 2021-05-01: 15 mg via INTRAVENOUS
  Filled 2021-05-01: qty 1

## 2021-05-01 NOTE — ED Notes (Signed)
Medicated for pain as per orders, pt also asked to remove pants and undergarment for ordered ultra sound ?

## 2021-05-01 NOTE — ED Triage Notes (Signed)
LLQ pain starting yesterday , nausea and emesis x 1 , no diarrhea . Hx ovarian cyst and states same .  ?

## 2021-05-01 NOTE — ED Notes (Signed)
Patient requested to not have a pelvic exam ?MD notified ?

## 2021-05-01 NOTE — ED Provider Notes (Signed)
?[facility] EMERGENCY DEPARTMENT ?Provider Note ? ? ?CSN: [id number] ?Arrival date & time: 05/01/21  1023 ? ?  ? ?History ? ?Chief Complaint  ?Patient presents with  ? Abdominal Pain  ? ? ?[name] is a 26 y.o. female presenting with a past medical history of a right-sided oophorectomy presenting left ovary pain that started yesterday.  Says she has a history of multiple ovarian cysts, 3 of which have required surgery.  Endorses nausea and one episode of vomiting, no changes in her bowel movements.  Last menstrual period 3/10.  No vaginal discharge.  Sexually active with female partner, no concerns for pregnancy.  Denies any fevers or chills.  No dysuria, hematuria, abnormal vaginal discharge or odor. ? ? ? ?Abdominal Pain ?Associated symptoms: nausea and vomiting   ?Associated symptoms: no chills, no diarrhea, no dysuria, no fever, no hematuria, no vaginal bleeding and no vaginal discharge   ? ?  ? ?Home Medications ?Prior to Admission medications   ?Medication Sig Start Date End Date Taking? Authorizing Provider  ?doxycycline (VIBRAMYCIN) 100 MG capsule Take 1 capsule (100 mg total) by mouth 2 (two) times daily. 02/18/21   Caccavale, Sophia, PA-C  ?   ? ?Allergies    ?Latex   ? ?Review of Systems   ?Review of Systems  ?Constitutional:  Negative for chills and fever.  ?Gastrointestinal:  Positive for abdominal pain, nausea and vomiting. Negative for diarrhea.  ?Genitourinary:  Positive for pelvic pain. Negative for dysuria, hematuria, vaginal bleeding, vaginal discharge and vaginal pain.  ? ?Physical Exam ?Updated Vital Signs ?BP 117/83   Pulse 90   Temp 98.4 ?F (36.9 ?C) (Oral)   Resp 16   Ht 5\' 4"  (1.626 m)   Wt 79.4 kg   LMP 04/05/2021 (Exact Date)   SpO2 100%   BMI 30.04 kg/m?  ?Physical Exam ?Vitals and nursing note reviewed.  ?Constitutional:   ?   Appearance: Normal appearance.  ?HENT:  ?   Head: Normocephalic and atraumatic.  ?Eyes:  ?   General: No scleral icterus. ?    Conjunctiva/sclera: Conjunctivae normal.  ?Pulmonary:  ?   Effort: Pulmonary effort is normal. No respiratory distress.  ?Abdominal:  ?   General: Abdomen is flat. Bowel sounds are normal.  ?   Palpations: Abdomen is soft.  ?   Tenderness: There is abdominal tenderness in the left lower quadrant.  ?   Hernia: No hernia is present.  ?Genitourinary: ?   Comments: Patient request not to have a GU exam ?Skin: ?   Findings: No rash.  ?Neurological:  ?   Mental Status: She is alert.  ?Psychiatric:     ?   Mood and Affect: Mood normal.  ? ? ?ED Results / Procedures / Treatments   ?Labs ?(all labs ordered are listed, but only abnormal results are displayed) ?Labs Reviewed  ?COMPREHENSIVE METABOLIC PANEL - Abnormal; Notable for the following components:  ?    Result Value  ? CO2 21 (*)   ? All other components within normal limits  ?CBC - Abnormal; Notable for the following components:  ? Hemoglobin 10.0 (*)   ? HCT 34.0 (*)   ? MCV 74.2 (*)   ? MCH 21.8 (*)   ? MCHC 29.4 (*)   ? RDW 18.0 (*)   ? All other components within normal limits  ?URINALYSIS, ROUTINE W REFLEX MICROSCOPIC - Abnormal; Notable for the following components:  ? APPearance HAZY (*)   ? Specific Gravity, Urine >  1.030 (*)   ? Ketones, ur 40 (*)   ? All other components within normal limits  ?LIPASE, BLOOD  ?PREGNANCY, URINE  ? ? ?EKG ?None ? ?Radiology ?US PELVIC COMPLETE W TRANSVAGINAL AND TORSION R/O ? ?Result Date: 05/01/2021 ?CLINICAL DATA:  LEFT lower quadrant pain, history of LEFT ovarian torsion, RIGHT salpingo oophorectomy for torsion, LMP 04/06/2021 EXAM: TRANSABDOMINAL AND TRANSVAGINAL ULTRASOUND OF PELVIS DOPPLER ULTRASOUND OF OVARIES TECHNIQUE: Both transabdominal and transvaginal ultrasound examinations of the pelvis were performed. Transabdominal technique was performed for global imaging of the pelvis including uterus, ovaries, adnexal regions, and pelvic cul-de-sac. It was necessary to proceed with endovaginal exam following the transabdominal  exam to visualize the endometrium. Color and duplex Doppler ultrasound was utilized to evaluate blood flow to the ovaries. COMPARISON:  04/20/2019 FINDINGS: Uterus Measurements: 8.6 x 4.0 x 5.3 cm = volume: 96 mL. Anteverted. Normal morphology without mass Endometrium Thickness: 9 mm.  No endometrial fluid or mass Right ovary Surgically absent Left ovary Measurements: 6.6 x 3.0 x 4.3 cm = volume: 44 mL. Complicated cystic lesion within LEFT ovary 3.0 x 2.8 x 2.7 cm, appearance consistent with a hemorrhagic cyst; no follow-up imaging recommended. Blood flow present within LEFT ovary on color Doppler imaging. Pulsed Doppler evaluation of LEFT ovary demonstrates normal low-resistance arterial and venous waveforms. RIGHT ovary surgically absent, unable to assess by Doppler. Other findings Trace free pelvic fluid.  No other adnexal masses. IMPRESSION: Unremarkable uterus and endometrial complex with surgical absence of RIGHT ovary. Hemorrhagic cyst LEFT ovary 3.0 cm diameter; no follow-up imaging recommended. No evidence of LEFT ovarian torsion. Electronically Signed   By: Ulyses Southward M.D.   On: 05/01/2021 14:36   ? ?Procedures ?Procedures  ? ?Medications Ordered in ED ?Medications  ?ketorolac (TORADOL) 15 MG/ML injection 15 mg (15 mg Intravenous Given 05/01/21 1326)  ?morphine (PF) 4 MG/ML injection 4 mg (4 mg Intravenous Given 05/01/21 1505)  ? ? ?ED Course/ Medical Decision Making/ A&P ?  ?                        ?Medical Decision Making ?Amount and/or Complexity of Data Reviewed ?Labs: ordered. ?Radiology: ordered. ? ?Risk ?Prescription drug management. ? ? ?Patient presents to the ED for concern of pelvic pain.  Differential includes but is not limited to ectopic pregnancy, ovarian cyst, ovarian torsion, malignancy, UTI, renal calculus, pyelonephritis. ? ?Co morbidities that complicate the patient evaluation include: Recurrent ovarian cyst ? ?Per internal/external chart review: Patient had her right ovary removed after  torsion and 2021.  Has had multiple other visits due to ovarian cyst. ? ? ?I performed a full physical exam, pertinent findings include: ?Pelvic exam deferred, no pertinent findings on abdominal exam ? ?Diagnostics: ? ?I ordered and viewed labs. The pertinent results include:  ?Hemoglobin 10, baseline ? ?I ordered and individually viewed pelvic ultrasound and agree with the radiologist that there are no signs of ectopic pregnancy or torsion.  They note a 3 cm left-sided ovarian cyst ? ? ?Treatment: ? ?I ordered Toradol and morphine for patient's pain.  Denied the need for Zofran ? ? ?MDM/Disposition: ?Patient requested to not have a pelvic exam today.  Ultrasound with ovarian cyst as expected.  White count normal, patient afebrile, low suspicion for infection or emergent condition today.  I reevaluated the patient and she reports feeling better with Toradol.  Her pain did start to come back so morphine was ordered.  At this point she  is stable to follow-up with her OB/GYN.  Agreeable to this plan.  Will use NSAIDs and Tylenol for continued pain and return with worsening symptoms. ? ? ?Final Clinical Impression(s) / ED Diagnoses ?Final diagnoses:  ?Cyst of left ovary  ? ? ?Rx / DC Orders ?Results and diagnoses were explained to the patient. Return precautions discussed in full. Patient had no additional questions and expressed complete understanding. ? ? ?This chart was dictated using voice recognition software.  Despite best efforts to proofread,  errors can occur which can change the documentation meaning.  ?  ?Saddie BendersRedwine, Deseree Zemaitis A, PA-C ?05/01/21 1527 ? ?  ?Alvira MondaySchlossman, Erin, MD ?05/02/21 1708 ? ?

## 2021-05-01 NOTE — Discharge Instructions (Addendum)
Please follow-up with your OB/GYN about your ovarian cyst.  You may use ibuprofen and Tylenol for your pain.  Return with any worsening symptoms. ?

## 2021-05-02 ENCOUNTER — Telehealth: Payer: Self-pay

## 2021-05-02 ENCOUNTER — Telehealth: Payer: Self-pay | Admitting: *Deleted

## 2021-05-02 DIAGNOSIS — Z9189 Other specified personal risk factors, not elsewhere classified: Secondary | ICD-10-CM

## 2021-05-02 NOTE — Telephone Encounter (Signed)
Transition Care Management Follow-up Telephone Call ?Date of discharge and from where: 05/01/2021[facility]  ?How have you been since you were released from the hospital? Pt stated she is doing well.  ?Any questions or concerns? No ? ?Items Reviewed: ?Did the pt receive and understand the discharge instructions provided? Yes  ?Medications obtained and verified?  No medications given at discharge ?Other? No  ?Any new allergies since your discharge? No  ?Dietary orders reviewed? No ?Do you have support at home? Yes  ? ?Home Care and Equipment/Supplies: ?Were home health services ordered? not applicable ?If so, what is the name of the agency? N/A  ?Has the agency set up a time to come to the patient's home? not applicable ?Were any new equipment or medical supplies ordered?  No ?What is the name of the medical supply agency? N/A ?Were you able to get the supplies/equipment? not applicable ?Do you have any questions related to the use of the equipment or supplies? No ? ?Functional Questionnaire: (I = Independent and D = Dependent) ?ADLs: I ? ?Bathing/Dressing- I ? ?Meal Prep- I ? ?Eating- I ? ?Maintaining continence- I ? ?Transferring/Ambulation- I ? ?Managing Meds- I ? ?Follow up appointments reviewed: ? ?PCP Hospital f/u appt confirmed? No   ?Specialist Hospital f/u appt confirmed? No   ?Are transportation arrangements needed? No  ?If their condition worsens, is the pt aware to call PCP or go to the Emergency Dept.? Yes ?Was the patient provided with contact information for the PCP's office or ED? Yes ?Was to pt encouraged to call back with questions or concerns? Yes  ?

## 2021-05-02 NOTE — Telephone Encounter (Signed)
? ?  Telephone encounter was:  Unsuccessful.  05/02/2021 ?Name: [name] MRN: [id number] DOB: September 04, 1995 ? ?Unsuccessful outbound call made today to assist with:   pcp ? ?Outreach Attempt:  1st Attempt ? ?A HIPAA compliant voice message was left requesting a return call.  Instructed patient to call back at   Instructed patient to call back at [phone]  at their earliest convenience. . ? ?[name] ?Care Guide , Embedded Care Coordination ?[place], Care Management  ?[phone] ?[address] [place] , [place] [address] ?Email : [name] ?  ? ?

## 2021-05-06 ENCOUNTER — Telehealth: Payer: Self-pay | Admitting: *Deleted

## 2021-05-06 NOTE — Telephone Encounter (Signed)
? ?  Telephone encounter was:  Unsuccessful.  05/06/2021 ?Name: [name] MRN: [id number] DOB: [birth year] ? ?Unsuccessful outbound call made today to assist with:   PCP ? ?Outreach Attempt:  2nd Attempt ? ?A HIPAA compliant voice message was left requesting a return call.  Instructed patient to call back at   Instructed patient to call back at [phone]  at their earliest convenience. . ? ?[name] -Selinda Eon ?Care Guide , Embedded Care Coordination ?, Care Management  ?[phone] ?[address] , [place] [address] ?Email : [email or web address] ? ? ?

## 2021-05-09 ENCOUNTER — Telehealth: Payer: Self-pay | Admitting: *Deleted

## 2021-05-09 NOTE — Telephone Encounter (Signed)
? ?  Telephone encounter was:  Successful.  ?05/09/2021 ?Name: LETANYA FROH MRN: [id number] DOB: 1995-02-10 ? ?SAMHITA KRETSCH is a 26 y.o. year old female who is a primary care patient of Pcp, No . The community resource team was consulted for assistance with  PCP ? ?Care guide performed the following interventions: patient aware of how to contact PCP or change if she so desires. ? ?Follow Up Plan:  No further follow up planned at this time. The patient has been provided with needed resources. ?Alois Cliche -Berneda Rose ?Care Guide , Embedded Care Coordination ?[place], Care Management  ?[phone] ?[address] [place] , [place] [address] ?Email : [name] ?   ?

## 2021-05-27 DIAGNOSIS — Z419 Encounter for procedure for purposes other than remedying health state, unspecified: Secondary | ICD-10-CM | POA: Diagnosis not present

## 2021-06-27 DIAGNOSIS — Z419 Encounter for procedure for purposes other than remedying health state, unspecified: Secondary | ICD-10-CM | POA: Diagnosis not present

## 2021-07-27 DIAGNOSIS — Z419 Encounter for procedure for purposes other than remedying health state, unspecified: Secondary | ICD-10-CM | POA: Diagnosis not present

## 2021-08-09 ENCOUNTER — Ambulatory Visit: Payer: Medicaid Other | Admitting: Obstetrics and Gynecology

## 2021-08-09 NOTE — Progress Notes (Deleted)
   ANNUAL EXAM Patient name: [name] MRN [id number]  Date of birth: Dec 04, 1995 Chief Complaint:   No chief complaint on file.  History of Present Illness:   [name] is a 26 y.o. G60P1011 female being seen today for a routine annual exam.   Current complaints: ***  No LMP recorded.   The pregnancy intention screening data noted above was reviewed. Potential methods of contraception were discussed. The patient elected to proceed with No data recorded.   Last pap 05/2020. Results were: NILM w/ HRHPV not done. H/O abnormal pap: no  Review of Systems:   Pertinent items are noted in HPI Denies any headaches, blurred vision, fatigue, shortness of breath, chest pain, abdominal pain, abnormal vaginal discharge/itching/odor/irritation, problems with periods, bowel movements, urination, or intercourse unless otherwise stated above. *** Pertinent History Reviewed:  Reviewed past medical,surgical, social and family history.  Reviewed problem list, medications and allergies. Physical Assessment:  There were no vitals filed for this visit.There is no height or weight on file to calculate BMI.   Physical Examination:  General appearance - well appearing, and in no distress Mental status - alert, oriented to person, place, and time Psych:  She has a normal mood and affect Skin - warm and dry, normal color, no suspicious lesions noted Chest - effort normal, all lung fields clear to auscultation bilaterally Heart - normal rate and regular rhythm Neck:  midline trachea, no thyromegaly or nodules Breasts - breasts appear normal, no suspicious masses, no skin or nipple changes or axillary nodes Abdomen - soft, nontender, nondistended, no masses or organomegaly Pelvic -  VULVA: normal appearing vulva with no masses, tenderness or lesions  VAGINA: normal appearing vagina with normal color and discharge, no lesions   CERVIX: normal appearing cervix without discharge or lesions, no CMT UTERUS:  uterus is felt to be normal size, shape, consistency and nontender  ADNEXA: No adnexal masses or tenderness noted. Extremities:  No swelling or varicosities noted  Chaperone present for exam  No results found for this or any previous visit (from the past 24 hour(s)).  Assessment & Plan:  Diagnoses and all orders for this visit:  Encounter for annual routine gynecological examination  - Cervical cancer screening: Discussed screening Q3 years. Reviewed importance of annual exams and limits of pap smear. Pap with reflex HPV normal 05/2020 - GC/CT: Discussed and recommended. Pt  {Blank single:19197::"accepts","declines"} - Gardasil: {Blank single:19197::"***","has not yet had. Will provide information","completed","has not yet had. Counseling provided and she declines","Has not yet had. Counseling provided and pt accepts"} - Birth Control: Same sex partner - Breast Health: Encouraged self breast awareness/exams. Teaching provided. - Follow-up: 12 months and prn   No orders of the defined types were placed in this encounter.   Meds: No orders of the defined types were placed in this encounter.   Follow-up: No follow-ups on file.  Milas Hock, MD 08/09/2021 8:10 AM

## 2021-08-26 DIAGNOSIS — F411 Generalized anxiety disorder: Secondary | ICD-10-CM | POA: Diagnosis not present

## 2021-08-26 DIAGNOSIS — F331 Major depressive disorder, recurrent, moderate: Secondary | ICD-10-CM | POA: Diagnosis not present

## 2021-08-27 DIAGNOSIS — Z419 Encounter for procedure for purposes other than remedying health state, unspecified: Secondary | ICD-10-CM | POA: Diagnosis not present

## 2021-09-02 DIAGNOSIS — F341 Dysthymic disorder: Secondary | ICD-10-CM | POA: Diagnosis not present

## 2021-09-02 DIAGNOSIS — F411 Generalized anxiety disorder: Secondary | ICD-10-CM | POA: Diagnosis not present

## 2021-09-02 DIAGNOSIS — F331 Major depressive disorder, recurrent, moderate: Secondary | ICD-10-CM | POA: Diagnosis not present

## 2021-09-09 DIAGNOSIS — F331 Major depressive disorder, recurrent, moderate: Secondary | ICD-10-CM | POA: Diagnosis not present

## 2021-09-09 DIAGNOSIS — F341 Dysthymic disorder: Secondary | ICD-10-CM | POA: Diagnosis not present

## 2021-09-09 DIAGNOSIS — F411 Generalized anxiety disorder: Secondary | ICD-10-CM | POA: Diagnosis not present

## 2021-09-27 DIAGNOSIS — Z419 Encounter for procedure for purposes other than remedying health state, unspecified: Secondary | ICD-10-CM | POA: Diagnosis not present

## 2021-10-21 ENCOUNTER — Encounter: Payer: Self-pay | Admitting: Advanced Practice Midwife

## 2021-10-21 DIAGNOSIS — Z90721 Acquired absence of ovaries, unilateral: Secondary | ICD-10-CM | POA: Insufficient documentation

## 2021-10-27 DIAGNOSIS — Z419 Encounter for procedure for purposes other than remedying health state, unspecified: Secondary | ICD-10-CM | POA: Diagnosis not present

## 2021-10-29 ENCOUNTER — Ambulatory Visit: Payer: Medicaid Other | Admitting: Advanced Practice Midwife

## 2021-10-29 ENCOUNTER — Other Ambulatory Visit (HOSPITAL_COMMUNITY)
Admission: RE | Admit: 2021-10-29 | Discharge: 2021-10-29 | Disposition: A | Discharge status: Home / Self Care | Payer: Medicaid Other | Source: Ambulatory Visit | Attending: Advanced Practice Midwife | Admitting: Advanced Practice Midwife

## 2021-10-29 ENCOUNTER — Encounter: Payer: Self-pay | Admitting: Advanced Practice Midwife

## 2021-10-29 VITALS — BP 118/77 | HR 86 | Ht 64.0 in | Wt 178.0 lb

## 2021-10-29 DIAGNOSIS — Z01419 Encounter for gynecological examination (general) (routine) without abnormal findings: Secondary | ICD-10-CM

## 2021-10-29 DIAGNOSIS — N6320 Unspecified lump in the left breast, unspecified quadrant: Secondary | ICD-10-CM | POA: Insufficient documentation

## 2021-10-29 DIAGNOSIS — N6342 Unspecified lump in left breast, subareolar: Secondary | ICD-10-CM

## 2021-10-29 NOTE — Progress Notes (Signed)
GYNECOLOGY ANNUAL PREVENTATIVE CARE ENCOUNTER NOTE  Subjective:   [name] is a 26 y.o. G27P1011 female here for a routine annual gynecologic exam.  Current complaints: left breast mass with intermittent axillary lymph node enlargement.  Noticed it 2 months ago.  .   Denies abnormal vaginal bleeding, discharge, pelvic pain, problems with intercourse or other gynecologic concerns.  States is in a same sex relationship and does not need contraception at this time   Gynecologic History Patient's last menstrual period was 10/13/2021 (exact date). Contraception: none Last Pap: 2019. Results were: abnormal  ASCUS in 2019 with negative HR HPV Last mammogram: never.  Obstetric History OB History  Gravida Para Term Preterm AB Living  2 1 1   1 1   SAB IAB Ectopic Multiple Live Births    1   0 1    # Outcome Date GA Lbr Len/2nd Weight Sex Delivery Anes PTL Lv  2 Term 05/09/16 [redacted]w[redacted]d 09:14 / 01:55 6 lb 14.1 oz (3.121 kg) M Vag-Spont EPI  LIV  1 IAB             Past Medical History:  Diagnosis Date   Eczema     Past Surgical History:  Procedure Laterality Date   LAPAROSCOPIC OVARIAN CYSTECTOMY N/A 04/22/2019   Procedure: LAPAROSCOPIC OVARIAN CYSTECTOMY & DETORSION;  Surgeon: 04/24/2019, MD;  Location: [facility];  Service: Gynecology;  Laterality: N/A;  Tracie RNFA confirmed with Chassity   ovary removed      Current Outpatient Medications on File Prior to Visit  Medication Sig Dispense Refill   Cholecalciferol 1.25 MG (50000 UT) capsule Take 1 capsule twice a week by oral route for 56 days. (Patient not taking: Reported on 10/29/2021)     doxycycline (VIBRAMYCIN) 100 MG capsule Take 1 capsule (100 mg total) by mouth 2 (two) times daily. (Patient not taking: Reported on 10/29/2021) 1 capsule 0   No current facility-administered medications on file prior to visit.    Allergies  Allergen Reactions   Codeine Swelling and Other (See Comments)   Latex Rash    Social History    Socioeconomic History   Marital status: Single    Spouse name: Not on file   Number of children: Not on file   Years of education: Not on file   Highest education level: Not on file  Occupational History   Not on file  Tobacco Use   Smoking status: Never   Smokeless tobacco: Never  Vaping Use   Vaping Use: Never used  Substance and Sexual Activity   Alcohol use: Yes    Comment: occ   Drug use: No   Sexual activity: Yes    Birth control/protection: None  Other Topics Concern   Not on file  Social History Narrative   Not on file   Social Determinants of Health   Financial Resource Strain: Not on file  Food Insecurity: Not on file  Transportation Needs: Not on file  Physical Activity: Not on file  Stress: Not on file  Social Connections: Not on file  Intimate Partner Violence: Not on file    Family History  Problem Relation Age of Onset   Aneurysm Mother    Fibroids Mother     The following portions of the patient's history were reviewed and updated as appropriate: allergies, current medications, past family history, past medical history, past social history, past surgical history and problem list.  Review of Systems Pertinent items noted in HPI and remainder  of comprehensive ROS otherwise negative.   Objective:  BP 118/77   Pulse 86   Ht 5\' 4"  (1.626 m)   Wt 178 lb (80.7 kg)   LMP 10/13/2021 (Exact Date)   BMI 30.55 kg/m  CONSTITUTIONAL: Well-developed, well-nourished female in no acute distress.  HENT:  Normocephalic, atraumatic, External right and left ear normal. Oropharynx is clear and moist EYES: Conjunctivae and EOM are normal. Pupils are equal, round, and reactive to light. No scleral icterus.  NECK: Normal range of motion, supple, no masses.  Normal thyroid.  SKIN: Skin is warm and dry. No rash noted. Not diaphoretic. No erythema. No pallor. NEUROLOGIC: Alert and oriented to person, place, and time. Normal reflexes, muscle tone coordination. No  cranial nerve deficit noted. PSYCHIATRIC: Normal mood and affect. Normal behavior. Normal judgment and thought content. CARDIOVASCULAR: Normal heart rate noted, regular rhythm RESPIRATORY: Clear to auscultation bilaterally. Effort and breath sounds normal, no problems with respiration noted.  BREASTS: Symmetric in size. No masses, skin changes, nipple drainage, or lymphadenopathy on right                    There is a mobile, nontender 1cm round rubbery mass just below left areola.  I cannot appreciate any lymphadenopathy in axilla.  . ABDOMEN: Soft, normal bowel sounds, no distention noted.  No tenderness, rebound or guarding.  PELVIC: Normal appearing external genitalia; normal appearing vaginal mucosa and cervix.  No abnormal discharge noted.  Pap smear obtained.  Normal uterine size, no other palpable masses, no uterine or adnexal tenderness. MUSCULOSKELETAL: Normal range of motion. No tenderness.  No cyanosis, clubbing, or edema.  2+ distal pulses.   Assessment:  Annual gynecologic examination with pap smear History of ASCUS pap Left breast mass   Plan:  Will follow up results of pap smear and manage accordingly. Mammogram  and diagnostic US scheduled for next week Routine preventative health maintenance measures emphasized. Please refer to After Visit Summary for other counseling recommendations.

## 2021-10-30 LAB — CYTOLOGY - PAP: Diagnosis: NEGATIVE

## 2021-11-06 ENCOUNTER — Ambulatory Visit
Admission: RE | Admit: 2021-11-06 | Discharge: 2021-11-06 | Disposition: A | Discharge status: Home / Self Care | Payer: Medicaid Other | Source: Ambulatory Visit | Attending: Advanced Practice Midwife | Admitting: Advanced Practice Midwife

## 2021-11-06 ENCOUNTER — Other Ambulatory Visit: Payer: Self-pay | Admitting: Advanced Practice Midwife

## 2021-11-06 DIAGNOSIS — N6342 Unspecified lump in left breast, subareolar: Secondary | ICD-10-CM

## 2021-11-06 DIAGNOSIS — N6320 Unspecified lump in the left breast, unspecified quadrant: Secondary | ICD-10-CM

## 2021-11-18 ENCOUNTER — Ambulatory Visit
Admission: RE | Admit: 2021-11-18 | Discharge: 2021-11-18 | Disposition: A | Discharge status: Home / Self Care | Payer: Medicaid Other | Source: Ambulatory Visit | Attending: Advanced Practice Midwife | Admitting: Advanced Practice Midwife

## 2021-11-18 DIAGNOSIS — N6320 Unspecified lump in the left breast, unspecified quadrant: Secondary | ICD-10-CM

## 2021-11-22 ENCOUNTER — Ambulatory Visit: Payer: Self-pay | Admitting: Surgery

## 2021-11-22 DIAGNOSIS — D242 Benign neoplasm of left breast: Secondary | ICD-10-CM | POA: Insufficient documentation

## 2021-11-22 NOTE — H&P (Signed)
Subjective   Chief Complaint: Breast Mass     History of Present Illness: [name] is a 26 y.o. female who is seen today as an office consultation at the request of Dr. Jimmye Norman for evaluation of Breast Mass .    This is a healthy 26 year old female with a past history of right oophorectomy for multiple ovarian cyst who presents with a 30-month history of a palpable mass in her left breast.  The mass has not really enlarged since she first noticed it.  She has a family history of breast cancer in a maternal aunt.  She underwent ultrasound of this area that revealed a 2.3 x 2.1 x 1.3 cm mass located at 4:00 in the left breast 3 cm from the nipple.  Biopsy showed a fibroepithelial lesion suspicious for fibroadenoma.  She presents now to discuss excision.   Review of Systems: A complete review of systems was obtained from the patient.  I have reviewed this information and discussed as appropriate with the patient.  See HPI as well for other ROS.  Review of Systems  Constitutional: Negative.   HENT: Negative.    Eyes: Negative.   Respiratory: Negative.    Cardiovascular: Negative.   Gastrointestinal: Negative.   Genitourinary: Negative.   Musculoskeletal: Negative.   Skin: Negative.   Neurological: Negative.   Endo/Heme/Allergies: Negative.   Psychiatric/Behavioral: Negative.        Medical History: History reviewed. No pertinent past medical history.  Patient Active Problem List  Diagnosis   Fibroadenoma, left   History of right oophorectomy   Ovarian cyst    Past Surgical History:  Procedure Laterality Date   OOPHORECTOMY Right    REMOVAL OVARIAN CYST Bilateral      No Known Allergies  No current outpatient medications on file prior to visit.   No current facility-administered medications on file prior to visit.    Family History  Problem Relation Age of Onset   Breast cancer Maternal Aunt    Aneurysm Other      Social History   Tobacco Use  Smoking  Status Never  Smokeless Tobacco Never     Social History   Socioeconomic History   Marital status: Single  Tobacco Use   Smoking status: Never   Smokeless tobacco: Never  Vaping Use   Vaping Use: Never used  Substance and Sexual Activity   Alcohol use: Yes   Drug use: Never    Objective:    Vitals:   11/22/21 1002  BP: 116/64  Temp: 36.8 C (98.3 F)  Weight: 82.3 kg (181 lb 6.4 oz)  Height: 162.6 cm (5\' 4" )    Body mass index is 31.14 kg/m.  Physical Exam   Constitutional:  WDWN in NAD, conversant, no obvious deformities; lying in bed comfortably Eyes:  Pupils equal, round; sclera anicteric; moist conjunctiva; no lid lag HENT:  Oral mucosa moist; good dentition  Neck:  No masses palpated, trachea midline; no thyromegaly Lungs:  CTA bilaterally; normal respiratory effort Breasts:  symmetric, no nipple changes; no palpable masses or lymphadenopathy on the right side.  The left breast has a palpable mass in the lower outer quadrant.  This measures approximately 2.5 cm across.  It is smooth and firm.  No overlying skin changes CV:  Regular rate and rhythm; no murmurs; extremities well-perfused with no edema Abd:  +bowel sounds, soft, non-tender, no palpable organomegaly; no palpable hernias Musc: Normal gait; no apparent clubbing or cyanosis in extremities Lymphatic:  No palpable cervical  or axillary lymphadenopathy Skin:  Warm, dry; no sign of jaundice Psychiatric - alert and oriented x 4; calm mood and affect   Labs, Imaging and Diagnostic Testing: Diagnosis Breast, left, needle core biopsy, 4:00 3 cmfn - FIBROEPITHELIAL LESION (SEE NOTE) Diagnosis Note Morphologic evaluation of the needle core biopsy reveals a fibroepithelial lesion with significantly increased stromal cellularity. This lesion is best classified on an excision specimen. Clifton James M.D. Pathologist, Electronic Signature (Case signed 11/19/2021)  CLINICAL DATA:  26 year old female  with a palpable lump in the LEFT breast. Family history of breast cancer.   EXAM: ULTRASOUND OF THE LEFT BREAST   COMPARISON:  None available.   FINDINGS: Targeted ultrasound is performed, showing an oval circumscribed hypoechoic mass in the LEFT breast at the 4 o'clock axis, 3 cm from the nipple, measuring 2.3 x 1.3 x 2.1 cm, corresponding to the palpable area of concern.   LEFT axilla was evaluated with ultrasound showing no enlarged or morphologically abnormal lymph nodes.   IMPRESSION: Oval circumscribed hypoechoic mass in the LEFT breast at the 4 o'clock axis, 3 cm from the nipple, measuring 2.3 cm, corresponding to the palpable area of concern. This may represent a benign fibroadenoma. Ultrasound-guided biopsy is recommended to ensure benignity.   RECOMMENDATION: Ultrasound-guided biopsy for the LEFT breast mass at the 4 o'clock axis.   Ultrasound-guided biopsy is scheduled on October 23rd.   I have discussed the findings and recommendations with the patient. If applicable, a reminder letter will be sent to the patient regarding the next appointment.   BI-RADS CATEGORY  4: Suspicious.     Electronically Signed   By: Bary Richard M.D.   On: 11/06/2021 11:21  Assessment and Plan:  Diagnoses and all orders for this visit:  Fibroadenoma, left     Recommend excision of left breast fibroadenoma.  The surgical procedure has been discussed with the patient.  Potential risks, benefits, alternative treatments, and expected outcomes have been explained.  All of the patient's questions at this time have been answered.  The likelihood of reaching the patient's treatment goal is good.  The patient understand the proposed surgical procedure and wishes to proceed.   No follow-ups on file.  Adryen Cookson Delbert Harness, MD  11/22/2021 10:39 AM

## 2021-11-27 DIAGNOSIS — Z419 Encounter for procedure for purposes other than remedying health state, unspecified: Secondary | ICD-10-CM | POA: Diagnosis not present

## 2021-12-11 ENCOUNTER — Other Ambulatory Visit: Payer: Self-pay | Admitting: Surgery

## 2021-12-11 DIAGNOSIS — D242 Benign neoplasm of left breast: Secondary | ICD-10-CM | POA: Diagnosis not present

## 2021-12-27 DIAGNOSIS — Z419 Encounter for procedure for purposes other than remedying health state, unspecified: Secondary | ICD-10-CM | POA: Diagnosis not present

## 2022-01-27 DIAGNOSIS — Z419 Encounter for procedure for purposes other than remedying health state, unspecified: Secondary | ICD-10-CM | POA: Diagnosis not present

## 2022-02-27 DIAGNOSIS — Z419 Encounter for procedure for purposes other than remedying health state, unspecified: Secondary | ICD-10-CM | POA: Diagnosis not present

## 2022-03-28 DIAGNOSIS — Z419 Encounter for procedure for purposes other than remedying health state, unspecified: Secondary | ICD-10-CM | POA: Diagnosis not present

## 2022-04-21 ENCOUNTER — Ambulatory Visit
Admission: EM | Admit: 2022-04-21 | Discharge: 2022-04-21 | Disposition: A | Discharge status: Home / Self Care | Payer: Medicaid Other | Attending: Urgent Care | Admitting: Urgent Care

## 2022-04-21 ENCOUNTER — Ambulatory Visit (INDEPENDENT_AMBULATORY_CARE_PROVIDER_SITE_OTHER): Payer: Medicaid Other

## 2022-04-21 DIAGNOSIS — M79642 Pain in left hand: Secondary | ICD-10-CM | POA: Diagnosis not present

## 2022-04-21 DIAGNOSIS — S6722XA Crushing injury of left hand, initial encounter: Secondary | ICD-10-CM

## 2022-04-21 DIAGNOSIS — M7989 Other specified soft tissue disorders: Secondary | ICD-10-CM | POA: Diagnosis not present

## 2022-04-21 DIAGNOSIS — S60222A Contusion of left hand, initial encounter: Secondary | ICD-10-CM

## 2022-04-21 NOTE — Discharge Instructions (Addendum)
Your x-ray was negative for fracture.  Lets manage this for hand contusion, deep tissue bruise.  You can use ibuprofen 400 mg every 6-8 hours as needed for pain and inflammation.  Try to avoid excessive and overuse of your hand.  If you are particularly busy on a particular day wherein you had to use your hand, you can do some icing for 20 minutes.

## 2022-04-21 NOTE — ED Provider Notes (Signed)
[facility]  Note:  This document was prepared using Systems analyst and may include unintentional dictation errors.  MRN: [id number] DOB: [birth year]  Subjective:   [name] is a 27 y.o. female presenting for 3-day history of persistent left hand pain, swelling and bruising.  Patient closed the car door accidentally over the hand and caught her knuckles and fingers.  This is the primary area of her bruising.  Has not used any medications for relief.  She has mild pain.  No current facility-administered medications for this encounter.  Current Outpatient Medications:    Cholecalciferol 1.25 MG (50000 UT) capsule, Take 1 capsule twice a week by oral route for 56 days. (Patient not taking: Reported on 10/29/2021), Disp: , Rfl:    doxycycline (VIBRAMYCIN) 100 MG capsule, Take 1 capsule (100 mg total) by mouth 2 (two) times daily. (Patient not taking: Reported on 10/29/2021), Disp: 1 capsule, Rfl: 0   Allergies  Allergen Reactions   Codeine Swelling and Other (See Comments)   Latex Rash    Past Medical History:  Diagnosis Date   Eczema      Past Surgical History:  Procedure Laterality Date   LAPAROSCOPIC OVARIAN CYSTECTOMY N/A 04/22/2019   Procedure: LAPAROSCOPIC OVARIAN CYSTECTOMY & DETORSION;  Surgeon: Donnamae Jude, MD;  Location: [facility];  Service: Gynecology;  Laterality: N/A;  Tracie RNFA confirmed with Chassity   ovary removed      Family History  Problem Relation Age of Onset   Aneurysm Mother    Fibroids Mother    Breast cancer Maternal Aunt 40 - 66    Social History   Tobacco Use   Smoking status: Never   Smokeless tobacco: Never  Vaping Use   Vaping Use: Never used  Substance Use Topics   Alcohol use: Yes    Comment: occ   Drug use: No    ROS   Objective:   Vitals: BP 110/77 (BP Location: Left Arm)   Pulse 93   Temp 98.3 F (36.8 C) (Oral)   Resp 16   LMP 04/13/2022 (Approximate)   SpO2 96%    Physical Exam Constitutional:      General: She is not in acute distress.    Appearance: Normal appearance. She is well-developed. She is not ill-appearing, toxic-appearing or diaphoretic.  HENT:     Head: Normocephalic and atraumatic.     Nose: Nose normal.     Mouth/Throat:     Mouth: Mucous membranes are moist.  Eyes:     General: No scleral icterus.       Right eye: No discharge.        Left eye: No discharge.     Extraocular Movements: Extraocular movements intact.  Cardiovascular:     Rate and Rhythm: Normal rate.  Pulmonary:     Effort: Pulmonary effort is normal.  Musculoskeletal:       Hands:  Skin:    General: Skin is warm and dry.  Neurological:     General: No focal deficit present.     Mental Status: She is alert and oriented to person, place, and time.  Psychiatric:        Mood and Affect: Mood normal.        Behavior: Behavior normal.     DG Hand Complete Left  Result Date: 04/21/2022 CLINICAL DATA:  Trauma, pain and swelling EXAM: LEFT HAND - COMPLETE 3+ VIEW COMPARISON:  None Available. FINDINGS: No fracture or dislocation  is seen. There is marked soft tissue swelling over the dorsum of the hand. IMPRESSION: No fracture or dislocation is seen in left hand. Electronically Signed   By: Elmer Picker M.D.   On: 04/21/2022 13:36     Assessment and Plan :   PDMP not reviewed this encounter.  1. Contusion of left hand, initial encounter   2. Left hand pain   3. Crushing injury of left hand, initial encounter     Recommended conservative management for left hand contusion. X-ray negative.  Use RICE method, ibuprofen for pain and inflammation. Counseled patient on potential for adverse effects with medications prescribed/recommended today, ER and return-to-clinic precautions discussed, patient verbalized understanding.    Jaynee Eagles, PA-C 04/21/22 1352

## 2022-04-21 NOTE — ED Triage Notes (Signed)
Patient presents to [facility] for left hand injury on Saturday, smashed her hand with car door. Concerned with bruising and swelling. Not taking anything for pain.

## 2022-04-28 DIAGNOSIS — Z419 Encounter for procedure for purposes other than remedying health state, unspecified: Secondary | ICD-10-CM | POA: Diagnosis not present

## 2022-05-28 DIAGNOSIS — Z419 Encounter for procedure for purposes other than remedying health state, unspecified: Secondary | ICD-10-CM | POA: Diagnosis not present

## 2022-06-24 ENCOUNTER — Other Ambulatory Visit: Payer: Self-pay

## 2022-06-24 DIAGNOSIS — N6320 Unspecified lump in the left breast, unspecified quadrant: Secondary | ICD-10-CM

## 2022-06-24 NOTE — Progress Notes (Signed)
Patient called and has felt left breast lump in the same area that she had breast biopsy last year.   Per Dr. Erin Fulling may put in order for breast ultrasound for patient. Erin Stammer RN

## 2022-06-28 DIAGNOSIS — Z419 Encounter for procedure for purposes other than remedying health state, unspecified: Secondary | ICD-10-CM | POA: Diagnosis not present

## 2022-07-01 ENCOUNTER — Ambulatory Visit: Payer: Medicaid Other | Admitting: Medical

## 2022-07-01 ENCOUNTER — Encounter: Payer: Self-pay | Admitting: Medical

## 2022-07-01 VITALS — BP 109/69 | HR 81 | Ht 64.0 in | Wt 176.0 lb

## 2022-07-01 DIAGNOSIS — N6342 Unspecified lump in left breast, subareolar: Secondary | ICD-10-CM

## 2022-07-01 NOTE — Progress Notes (Signed)
Pt c/o breast lump in left side.

## 2022-07-01 NOTE — Progress Notes (Signed)
   History:  [name] is a 27 y.o. G2P1011 who presents to clinic today for left breast mass. Patient had a mass in left breast in the past and had to have it removed. It was a benign tumor. She noted intermittent pain starting about 3 weeks ago in the same area of the left breast and has noted a mass. She states pain occurs a couple of times a day each day now and has increased in frequency since onset. She denies fever, erythema today. She is not breastfeeding.    The following portions of the patient's history were reviewed and updated as appropriate: allergies, current medications, family history, past medical history, social history, past surgical history and problem list.  Review of Systems:  Review of Systems  Constitutional:  Negative for fever.  Gastrointestinal:  Negative for diarrhea.  Skin:  Negative for rash.      Objective:  Physical Exam BP 109/69   Pulse 81   Ht 5\' 4"  (1.626 m)   Wt 176 lb (79.8 kg)   LMP 05/31/2022 (Approximate)   BMI 30.21 kg/m  Physical Exam Constitutional:      Appearance: Normal appearance. She is normal weight. She is not ill-appearing.  Cardiovascular:     Rate and Rhythm: Normal rate.  Pulmonary:     Effort: Pulmonary effort is normal.  Chest:  Breasts:    Right: No swelling, bleeding, inverted nipple, mass, nipple discharge, skin change or tenderness.     Left: Mass (possible fibrocystic changed noted subaerolar and at 9:00) present. No swelling, bleeding, inverted nipple, nipple discharge, skin change or tenderness.  Abdominal:     General: Abdomen is flat.  Neurological:     Mental Status: She is alert and oriented to person, place, and time.  Psychiatric:        Mood and Affect: Mood normal.     Health Maintenance Due  Topic Date Due   COVID-19 Vaccine (1) Never done   Hepatitis C Screening  Never done   DTaP/Tdap/Td (1 - Tdap) Never done    Labs, imaging and previous visits in Epic and Care Everywhere  reviewed  Assessment & Plan:  1. Subareolar mass of left breast - US ordered prior to today will be schedule for ASAP  - Follow-up per results   Approximately 10 minutes of total time was spent with this patient on history taking, chart review, patient education, physical exam and documentation   Return if symptoms worsen or fail to improve.  Marny Lowenstein, PA-C 07/01/2022 9:26 AM

## 2022-07-11 ENCOUNTER — Other Ambulatory Visit: Payer: Medicaid Other

## 2022-07-28 DIAGNOSIS — Z419 Encounter for procedure for purposes other than remedying health state, unspecified: Secondary | ICD-10-CM | POA: Diagnosis not present

## 2022-08-13 ENCOUNTER — Ambulatory Visit: Admission: RE | Admit: 2022-08-13 | Payer: Medicaid Other | Source: Ambulatory Visit

## 2022-08-13 DIAGNOSIS — N6320 Unspecified lump in the left breast, unspecified quadrant: Secondary | ICD-10-CM | POA: Diagnosis not present

## 2022-08-13 DIAGNOSIS — Z9889 Other specified postprocedural states: Secondary | ICD-10-CM | POA: Diagnosis not present

## 2022-08-28 DIAGNOSIS — Z419 Encounter for procedure for purposes other than remedying health state, unspecified: Secondary | ICD-10-CM | POA: Diagnosis not present

## 2022-08-31 ENCOUNTER — Encounter: Payer: Self-pay | Admitting: Obstetrics & Gynecology

## 2022-09-01 ENCOUNTER — Ambulatory Visit (INDEPENDENT_AMBULATORY_CARE_PROVIDER_SITE_OTHER): Payer: Medicaid Other

## 2022-09-01 VITALS — BP 105/70 | HR 93 | Wt 178.0 lb

## 2022-09-01 DIAGNOSIS — R350 Frequency of micturition: Secondary | ICD-10-CM | POA: Diagnosis not present

## 2022-09-01 DIAGNOSIS — B379 Candidiasis, unspecified: Secondary | ICD-10-CM

## 2022-09-01 LAB — POCT URINALYSIS DIPSTICK
Bilirubin, UA: NEGATIVE
Blood, UA: POSITIVE
Glucose, UA: NEGATIVE
Ketones, UA: NEGATIVE
Nitrite, UA: POSITIVE
Protein, UA: POSITIVE — AB
Spec Grav, UA: 1.01 (ref 1.010–1.025)
Urobilinogen, UA: 0.2 E.U./dL
pH, UA: 7 (ref 5.0–8.0)

## 2022-09-01 MED ORDER — FLUCONAZOLE 150 MG PO TABS: ORAL_TABLET | ORAL | 1 refills | Status: DC

## 2022-09-01 MED ORDER — NITROFURANTOIN MONOHYD MACRO 100 MG PO CAPS: ORAL_CAPSULE | ORAL | 0 refills | Status: DC

## 2022-09-01 NOTE — Progress Notes (Signed)
SUBJECTIVE: [name] is a 27 y.o. female who complains of urinary frequency, urgency and dysuria x 1 days, without flank pain, fever, chills, or abnormal vaginal discharge or bleeding.   OBJECTIVE: Appears well, in no apparent distress.  Vital signs are normal. Urine dipstick shows positive for nitrates.    ASSESSMENT: Dysuria  PLAN: Treatment per orders.  Call or return to clinic prn if these symptoms worsen or fail to improve as anticipated.   Kavan Devan l Somaly Marteney, CMA

## 2022-09-28 DIAGNOSIS — Z419 Encounter for procedure for purposes other than remedying health state, unspecified: Secondary | ICD-10-CM | POA: Diagnosis not present

## 2022-09-30 NOTE — Progress Notes (Signed)
Patient seen and assessed by nursing staff.  Agree with documentation and plan.  

## 2022-10-07 ENCOUNTER — Other Ambulatory Visit: Payer: Self-pay

## 2022-10-07 ENCOUNTER — Emergency Department (HOSPITAL_BASED_OUTPATIENT_CLINIC_OR_DEPARTMENT_OTHER)
Admission: EM | Admit: 2022-10-07 | Discharge: 2022-10-07 | Disposition: A | Discharge status: Home / Self Care | Payer: Medicaid Other | Attending: Emergency Medicine | Admitting: Emergency Medicine

## 2022-10-07 ENCOUNTER — Encounter (HOSPITAL_BASED_OUTPATIENT_CLINIC_OR_DEPARTMENT_OTHER): Payer: Self-pay | Admitting: Emergency Medicine

## 2022-10-07 ENCOUNTER — Ambulatory Visit (INDEPENDENT_AMBULATORY_CARE_PROVIDER_SITE_OTHER): Payer: Medicaid Other

## 2022-10-07 ENCOUNTER — Emergency Department (HOSPITAL_BASED_OUTPATIENT_CLINIC_OR_DEPARTMENT_OTHER): Payer: Medicaid Other

## 2022-10-07 ENCOUNTER — Encounter: Payer: Self-pay | Admitting: Obstetrics & Gynecology

## 2022-10-07 VITALS — BP 114/73 | HR 86 | Wt 173.0 lb

## 2022-10-07 DIAGNOSIS — R300 Dysuria: Secondary | ICD-10-CM | POA: Diagnosis not present

## 2022-10-07 DIAGNOSIS — R109 Unspecified abdominal pain: Secondary | ICD-10-CM | POA: Diagnosis not present

## 2022-10-07 DIAGNOSIS — Z91040 Latex allergy status: Secondary | ICD-10-CM | POA: Diagnosis not present

## 2022-10-07 DIAGNOSIS — Z20822 Contact with and (suspected) exposure to covid-19: Secondary | ICD-10-CM | POA: Insufficient documentation

## 2022-10-07 DIAGNOSIS — K5730 Diverticulosis of large intestine without perforation or abscess without bleeding: Secondary | ICD-10-CM | POA: Diagnosis not present

## 2022-10-07 DIAGNOSIS — R1032 Left lower quadrant pain: Secondary | ICD-10-CM | POA: Diagnosis not present

## 2022-10-07 DIAGNOSIS — R112 Nausea with vomiting, unspecified: Secondary | ICD-10-CM | POA: Diagnosis not present

## 2022-10-07 LAB — URINALYSIS, W/ REFLEX TO CULTURE (INFECTION SUSPECTED)
Bilirubin Urine: NEGATIVE
Glucose, UA: NEGATIVE mg/dL
Hgb urine dipstick: NEGATIVE
Ketones, ur: 15 mg/dL — AB
Leukocytes,Ua: NEGATIVE
Nitrite: NEGATIVE
Protein, ur: NEGATIVE mg/dL
Specific Gravity, Urine: >=1.03 (ref 1.005–1.030)
pH: 6.5 (ref 5.0–8.0)

## 2022-10-07 LAB — PREGNANCY, URINE: Preg Test, Ur: NEGATIVE

## 2022-10-07 LAB — SARS CORONAVIRUS 2 BY RT PCR: SARS Coronavirus 2 by RT PCR: NEGATIVE

## 2022-10-07 NOTE — ED Provider Notes (Addendum)
[place] [facility]GENCY DE[facility]MENT AT [facility] [place] Provider Note   CSN: [id number] Arrival date & time: 10/07/22  0335     History  No chief complaint on file.   [name] is a 27 y.o. female.  Patient is a 27 year old female presenting with complaints of left flank pain.  This began yesterday in the absence of any injury or trauma.  She describes an intermittent pain that seems worse when she lays on her left side.  She denies any bowel or bladder complaints.  No fevers or chills.  She does say that a coworker was recently diagnosed with COVID-19, but patient denies having cough, congestion, or fever.  The history is provided by the patient.       Home Medications Prior to Admission medications   Medication Sig Start Date End Date Taking? Authorizing Provider  Cholecalciferol 1.25 MG (50000 UT) capsule Take 1 capsule twice a week by oral route for 56 days. Patient not taking: Reported on 10/29/2021 02/08/15   [provider]  doxycycline (VIBRAMYCIN) 100 MG capsule Take 1 capsule (100 mg total) by mouth 2 (two) times daily. Patient not taking: Reported on 10/29/2021 02/18/21   Caccavale, Sophia, PA-C  fluconazole (DIFLUCAN) 150 MG tablet Take one tablet by mouth. Repeat in 3 days if symptoms persists. 09/01/22   Reva Bores, MD  nitrofurantoin, macrocrystal-monohydrate, (MACROBID) 100 MG capsule Take one tablet BID x 5 days. 09/01/22   Reva Bores, MD      Allergies    Codeine and Latex    Review of Systems   Review of Systems  All other systems reviewed and are negative.   Physical Exam Updated Vital Signs BP 129/81 (BP Location: Right Arm)   Pulse 100   Temp 99.3 F (37.4 C) (Oral)   Resp 18   Ht 5\' 4"  (1.626 m)   Wt 81.6 kg   LMP 09/16/2022 (Exact Date)   SpO2 100%   BMI 30.90 kg/m  Physical Exam Vitals and nursing note reviewed.  Constitutional:      General: She is not in acute distress.    Appearance: She is well-developed. She is not  diaphoretic.  HENT:     Head: Normocephalic and atraumatic.  Cardiovascular:     Rate and Rhythm: Normal rate and regular rhythm.     Heart sounds: No murmur heard.    No friction rub. No gallop.  Pulmonary:     Effort: Pulmonary effort is normal. No respiratory distress.     Breath sounds: Normal breath sounds. No wheezing.  Abdominal:     General: Bowel sounds are normal. There is no distension.     Palpations: Abdomen is soft.     Tenderness: There is no abdominal tenderness. There is left CVA tenderness. There is no right CVA tenderness, guarding or rebound.  Musculoskeletal:        General: Normal range of motion.     Cervical back: Normal range of motion and neck supple.  Skin:    General: Skin is warm and dry.  Neurological:     General: No focal deficit present.     Mental Status: She is alert and oriented to person, place, and time.     ED Results / Procedures / Treatments   Labs (all labs ordered are listed, but only abnormal results are displayed) Labs Reviewed - No data to display  EKG None  Radiology No results found.  Procedures Procedures    Medications Ordered  in ED Medications - No data to display  ED Course/ Medical Decision Making/ A&P  Patient is a 27 year old female presenting with complaints of left flank pain as described in the HPI.  Patient arrives here afebrile with stable vital signs.  There is mild tenderness in the left CVA region and left upper quadrant, but no peritoneal signs.  Exam otherwise unremarkable.  Workup initiated including urinalysis showing no evidence of for infection and urine pregnancy test which was negative.  COVID test also negative.  CT scan with renal protocol obtained showing findings suggestive of early appendicitis, but no evidence for renal calculus or explanation for her left-sided abdominal/flank pain.  After obtaining the radiology interpretation of the CT scan, I did return to the room and examined the  patient's abdomen once again.  She has no right lower quadrant tenderness and abdominal exam is quite benign.  I highly doubt this patient has appendicitis.  Patient and I are in agreement that we will give this situation time and see how things transpire.  If she was to worsen over the next 24 hours, patient should return to the ER for further evaluation with repeat CT scan with IV and oral contrast.  Final Clinical Impression(s) / ED Diagnoses Final diagnoses:  None    Rx / DC Orders ED Discharge Orders     None         Geoffery Lyons, MD 10/07/22 [id number]    Geoffery Lyons, MD 10/07/22 [phone]

## 2022-10-07 NOTE — ED Notes (Signed)
Pt reports recent exposure to Covid, also c/o left flank pain.  +n/v Denies any dysuria  Covid and UA sent

## 2022-10-07 NOTE — ED Triage Notes (Signed)
Pt states works at a [employer], and another employee was COVID +, states she has same Sx as other person. States having left side flank pain, with nausea and vomiting.

## 2022-10-07 NOTE — Discharge Instructions (Signed)
Take ibuprofen 600 mg every 6 hours as needed for pain.  Return to the ER if you develop worsening pain, pain moving to the right lower abdomen, high fever, or for other new and concerning symptoms.

## 2022-10-07 NOTE — Progress Notes (Unsigned)
Patient was evaluated in ED this morning for some cramping. Patient states she still feels like she might have UTI. Will send urine for culture as it was not collected upon last mention of symptoms. No urine culture collected in ED.    Patient states she is having other symptoms like bloating. Patient advised to make annual exam appointment since last appt was 10/2021.Armandina Stammer RN

## 2022-10-08 ENCOUNTER — Other Ambulatory Visit: Payer: Self-pay

## 2022-10-08 ENCOUNTER — Encounter (HOSPITAL_BASED_OUTPATIENT_CLINIC_OR_DEPARTMENT_OTHER): Payer: Self-pay

## 2022-10-08 ENCOUNTER — Emergency Department (HOSPITAL_BASED_OUTPATIENT_CLINIC_OR_DEPARTMENT_OTHER)
Admission: EM | Admit: 2022-10-08 | Discharge: 2022-10-08 | Disposition: A | Discharge status: Home / Self Care | Payer: Medicaid Other | Attending: Emergency Medicine | Admitting: Emergency Medicine

## 2022-10-08 DIAGNOSIS — R1032 Left lower quadrant pain: Secondary | ICD-10-CM | POA: Insufficient documentation

## 2022-10-08 DIAGNOSIS — Z5321 Procedure and treatment not carried out due to patient leaving prior to being seen by health care provider: Secondary | ICD-10-CM | POA: Insufficient documentation

## 2022-10-08 LAB — URINALYSIS, ROUTINE W REFLEX MICROSCOPIC
Bilirubin Urine: NEGATIVE
Glucose, UA: NEGATIVE mg/dL
Hgb urine dipstick: NEGATIVE
Ketones, ur: NEGATIVE mg/dL
Nitrite: NEGATIVE
Protein, ur: NEGATIVE mg/dL
Specific Gravity, Urine: 1.025 (ref 1.005–1.030)
pH: 6 (ref 5.0–8.0)

## 2022-10-08 LAB — URINALYSIS, MICROSCOPIC (REFLEX)

## 2022-10-08 LAB — PREGNANCY, URINE: Preg Test, Ur: NEGATIVE

## 2022-10-08 NOTE — ED Notes (Signed)
Pt left d/t childcare conflict.

## 2022-10-08 NOTE — ED Triage Notes (Signed)
Pt presents to ED from home C/O LLQ  abdominal pain radiating to L flank.

## 2022-10-09 LAB — URINE CULTURE

## 2022-10-13 ENCOUNTER — Encounter: Payer: Self-pay | Admitting: Family Medicine

## 2022-10-13 ENCOUNTER — Ambulatory Visit (INDEPENDENT_AMBULATORY_CARE_PROVIDER_SITE_OTHER): Payer: Medicaid Other | Admitting: Family Medicine

## 2022-10-13 VITALS — BP 111/65 | HR 80 | Ht 64.0 in | Wt 175.0 lb

## 2022-10-13 DIAGNOSIS — J302 Other seasonal allergic rhinitis: Secondary | ICD-10-CM | POA: Insufficient documentation

## 2022-10-13 DIAGNOSIS — N83209 Unspecified ovarian cyst, unspecified side: Secondary | ICD-10-CM

## 2022-10-13 DIAGNOSIS — L2082 Flexural eczema: Secondary | ICD-10-CM | POA: Diagnosis not present

## 2022-10-13 DIAGNOSIS — Z0000 Encounter for general adult medical examination without abnormal findings: Secondary | ICD-10-CM

## 2022-10-13 NOTE — Patient Instructions (Signed)
Thank you for choosing [facility] for your Primary Care needs. I am excited for the opportunity to partner with you to meet your health care goals. It was a pleasure meeting you today!  Information on diet, exercise, and health maintenance recommendations are listed below. This is information to help you be sure you are on track for optimal health and monitoring.   Please look over this and let us know if you have any questions or if you have completed any of the health maintenance outside of [place] so that we can be sure your records are up to date.  ___________________________________________________________  MyChart:  For all urgent or time sensitive needs we ask that you please call the office to avoid delays. Our number is [phone]. MyChart is not constantly monitored and due to the large volume of messages a day, replies may take up to 72 business hours.  MyChart Policy: MyChart allows for you to see your visit notes, after visit summary, provider recommendations, lab and tests results, make an appointment, request refills, and contact your provider or the office for non-urgent questions or concerns. Providers are seeing patients during normal business hours and do not have built in time to review MyChart messages.  We ask that you allow a minimum of 3 business days for responses to MyChart messages. For this reason, please do not send urgent requests through MyChart. Please call the office at [phone]. New and ongoing conditions may require a visit. We have virtual and in-person visits available for your convenience.  Complex MyChart concerns may require a visit. Your provider may request you schedule a virtual or in-person visit to ensure we are providing the best care possible. MyChart messages sent after 11:00 AM on Friday will not be received by the provider until Monday morning.    Lab and Test Results: You will receive your lab and test  results on MyChart as soon as they are completed and results have been sent by the lab or testing facility. Due to this service, you will receive your results BEFORE your provider.  I review lab and test results each morning prior to seeing patients. Some results require collaboration with other providers to ensure you are receiving the most appropriate care. For this reason, we ask that you please allow a minimum of 3-5 business days from the time that ALL results have been received for your provider to receive and review lab and test results and contact you about these.  Most lab and test result comments from the provider will be sent through MyChart. Your provider may recommend changes to the plan of care, follow-up visits, repeat testing, ask questions, or request an office visit to discuss these results. You may reply directly to this message or call the office to provide information for the provider or set up an appointment. In some instances, you will be called with test results and recommendations. Please let us know if this is preferred and we will make note of this in your chart to provide this for you.    If you have not heard a response to your lab or test results in 5 business days from all results returning to MyChart, please call the office to let us know. We ask that you please avoid calling prior to this time unless there is an emergent concern. Due to high call volumes, this can delay the resulting process.  After Hours: For all non-emergency after hours needs, please   call the office at [phone] and select the option to reach the on-call  service. On-call services are shared between multiple [facility] offices and therefore it will not be possible to speak directly with your provider. On-call providers may provide medical advice and recommendations, but are unable to provide refills for maintenance medications.  For all emergency or urgent medical needs after normal business hours, we  recommend that you seek care at the closest Urgent Care or Emergency Department to ensure appropriate treatment in a timely manner.  [facility] has a 24 hour emergency room located on the ground floor for your convenience.   Urgent Concerns During the Business Day Providers are seeing patients from 8AM to 5PM with a busy schedule and are most often not able to respond to non-urgent calls until the end of the day or the next business day. If you should have URGENT concerns during the day, please call and speak to the nurse or schedule a same day appointment so that we can address your concern without delay.   Thank you, again, for choosing me as your health care partner. I appreciate your trust and look forward to learning more about you!   Darcey Demma B. Nhi Butrum, DNP, FNP-C  ___________________________________________________________  Health Maintenance Recommendations Screening Testing Mammogram Every 1-2 years based on history and risk factors Starting at age 50 Pap Smear Ages 21-39 every 3 years Ages 30-65 every 5 years with HPV testing More frequent testing may be required based on results and history Colon Cancer Screening Every 1-10 years based on test performed, risk factors, and history Starting at age 45 Bone Density Screening Every 2-10 years based on history Starting at age 65 for women Recommendations for men differ based on medication usage, history, and risk factors AAA Screening One time ultrasound Men 65-75 years old who have ever smoked Lung Cancer Screening Low Dose Lung CT every 12 months Age 50-80 years with a 20 pack-year smoking history who still smoke or who have quit within the last 15 years  Screening Labs Routine  Labs: Complete Blood Count (CBC), Complete Metabolic Panel (CMP), Cholesterol (Lipid Panel) Every 6-12 months based on history and medications May be recommended more frequently based on current conditions or previous results Hemoglobin  A1c Lab Every 3-12 months based on history and previous results Starting at age 45 or earlier with diagnosis of diabetes, high cholesterol, BMI >26, and/or risk factors Frequent monitoring for patients with diabetes to ensure blood sugar control Thyroid Panel  Every 6 months based on history, symptoms, and risk factors May be repeated more often if on medication HIV One time testing for all patients 13 and older May be repeated more frequently for patients with increased risk factors or exposure Hepatitis C One time testing for all patients 18 and older May be repeated more frequently for patients with increased risk factors or exposure Gonorrhea, Chlamydia Every 12 months for all sexually active persons 13-24 years Additional monitoring may be recommended for those who are considered high risk or who have symptoms PSA Men 40-54 years old with risk factors Additional screening may be recommended from age 55-69 based on risk factors, symptoms, and history  Vaccine Recommendations Tetanus Booster All adults every 10 years Flu Vaccine All patients 6 months and older every year COVID Vaccine All patients 12 years and older Initial dosing with booster May recommend additional booster based on age and health history HPV Vaccine 2 doses all patients age 9-26 Dosing may be considered   for patients over 26 Shingles Vaccine (Shingrix) 2 doses all adults 50 years and older Pneumonia (Pneumovax 23) All adults 65 years and older May recommend earlier dosing based on health history Pneumonia (Prevnar 13) All adults 65 years and older Dosed 1 year after Pneumovax 23 Pneumonia (Prevnar 20) All adults 65 years and older (adults 19-64 with certain conditions or risk factors) 1 dose  For those who have not received Prevnar 13 vaccine previously   Additional Screening, Testing, and Vaccinations may be recommended on an individualized basis based on family history, health history, risk  factors, and/or exposure.  __________________________________________________________  Diet Recommendations for All Patients  I recommend that all patients maintain a diet low in saturated fats, carbohydrates, and cholesterol. While this can be challenging at first, it is not impossible and small changes can make big differences.  Things to try: Decreasing the amount of soda, sweet tea, and/or juice to one or less per day and replace with water While water is always the first choice, if you do not like water you may consider adding a water additive without sugar to improve the taste other sugar free drinks Replace potatoes with a brightly colored vegetable  Use healthy oils, such as canola oil or olive oil, instead of butter or hard margarine Limit your bread intake to two pieces or less a day Replace regular pasta with low carb pasta options Bake, broil, or grill foods instead of frying Monitor portion sizes  Eat smaller, more frequent meals throughout the day instead of large meals  An important thing to remember is, if you love foods that are not great for your health, you don't have to give them up completely. Instead, allow these foods to be a reward when you have done well. Allowing yourself to still have special treats every once in a while is a nice way to tell yourself thank you for working hard to keep yourself healthy.   Also remember that every day is a new day. If you have a bad day and "fall off the wagon", you can still climb right back up and keep moving along on your journey!  We have resources available to help you!  Some websites that may be helpful include: www.MyPlate.gov  Www.VeryWellFit.com _____________________________________________________________  Activity Recommendations for All Patients  I recommend that all adults get at least 20 minutes of moderate physical activity that elevates your heart rate at least 5 days out of the week.  Some examples  include: Walking or jogging at a pace that allows you to carry on a conversation Cycling (stationary bike or outdoors) Water aerobics Yoga Weight lifting Dancing If physical limitations prevent you from putting stress on your joints, exercise in a pool or seated in a chair are excellent options.  Do determine your MAXIMUM heart rate for activity: 220 - YOUR AGE = MAX Heart Rate   Remember! Do not push yourself too hard.  Start slowly and build up your pace, speed, weight, time in exercise, etc.  Allow your body to rest between exercise and get good sleep. You will need more water than normal when you are exerting yourself. Do not wait until you are thirsty to drink. Drink with a purpose of getting in at least 8, 8 ounce glasses of water a day plus more depending on how much you exercise and sweat.    If you begin to develop dizziness, chest pain, abdominal pain, jaw pain, shortness of breath, headache, vision changes, lightheadedness, or other concerning symptoms,   stop the activity and allow your body to rest. If your symptoms are severe, seek emergency evaluation immediately. If your symptoms are concerning, but not severe, please let us know so that we can recommend further evaluation.     

## 2022-10-13 NOTE — Assessment & Plan Note (Signed)
Chronic condition, flares with stress and in creases of arms, neck, and wrists. Currently not bothersome. -Continue use of cortisone cream as needed and supportive measures.

## 2022-10-13 NOTE — Progress Notes (Signed)
New Patient Office Visit  Subjective    Patient ID: [name], female    DOB: [birth year]  Age: 27 y.o. MRN: [id number]  CC:  Chief Complaint  Patient presents with   Establish Care    HPI [name] presents to establish care   Discussed the use of AI scribe software for clinical note transcription with the patient, who gave verbal consent to proceed.  History of Present Illness   The patient, with a history of allergies, eczema, and recurrent ovarian cysts, presents for establishing care. She has not had primary care recently and has been seeking care from her OB/GYN for any health issues. She has a surgical history of right ovarian removal due to an ovarian cyst in 2013. Since then, she has had two more ovarian cysts.  The patient recently presented to the ED with abdominal pain, which was ongoing and exacerbated by eating. The pain was mostly left abdomen. The pain resolved around the end of the week following the ED visit. She also experienced vomiting once during this episode. No acute symptoms today.   She has regular, albeit heavy, menstrual cycles and is not currently on any birth control. She has a known allergy to codeine and latex and is not currently on any medications.  The patient also reports seasonal allergies, which are worse in the spring, and stress-induced eczema, which typically presents in the creases of her arms, neck, knees, and wrists. She manages her eczema with cortisone cream as needed.  The patient has a family history of various cancers, including lymphoma in her grandmother, breast cancer in a maternal aunt, and pancreatic cancer in a paternal aunt. Her mother has a history of an aneurysm and fibroids.  She denies any use of alcohol, recreational drugs, or nicotine. She lives with her boyfriend and has a six-year-old son. She works in office support at a local school.         LMP 09/16/22   Outpatient Encounter Medications as of 10/13/2022   Medication Sig   [DISCONTINUED] Cholecalciferol 1.25 MG (50000 UT) capsule Take 1 capsule twice a week by oral route for 56 days. (Patient not taking: Reported on 10/29/2021)   [DISCONTINUED] doxycycline (VIBRAMYCIN) 100 MG capsule Take 1 capsule (100 mg total) by mouth 2 (two) times daily. (Patient not taking: Reported on 10/29/2021)   [DISCONTINUED] fluconazole (DIFLUCAN) 150 MG tablet Take one tablet by mouth. Repeat in 3 days if symptoms persists. (Patient not taking: Reported on 10/07/2022)   [DISCONTINUED] nitrofurantoin, macrocrystal-monohydrate, (MACROBID) 100 MG capsule Take one tablet BID x 5 days. (Patient not taking: Reported on 10/07/2022)   No facility-administered encounter medications on file as of 10/13/2022.    Past Medical History:  Diagnosis Date   Allergy    Eczema     Past Surgical History:  Procedure Laterality Date   BREAST SURGERY     LAPAROSCOPIC OVARIAN CYSTECTOMY N/A 04/22/2019   Procedure: LAPAROSCOPIC OVARIAN CYSTECTOMY & DETORSION;  Surgeon: Reva Bores, MD;  Location: [facility] OR;  Service: Gynecology;  Laterality: N/A;  Tracie RNFA confirmed with Chassity   ovary removed Right 2013    Family History  Problem Relation Age of Onset   Aneurysm Mother    Fibroids Mother    Cancer Paternal Grandmother        lymphoma   Cancer Maternal Aunt        breast   Cancer Paternal Aunt        pancreatic  Social History   Socioeconomic History   Marital status: Single    Spouse name: Not on file   Number of children: Not on file   Years of education: Not on file   Highest education level: Not on file  Occupational History   Not on file  Tobacco Use   Smoking status: Never   Smokeless tobacco: Never  Vaping Use   Vaping status: Never Used  Substance and Sexual Activity   Alcohol use: Yes    Comment: Socially 1-2 times a month at most   Drug use: No   Sexual activity: Yes    Birth control/protection: None  Other Topics Concern   Not on file  Social  History Narrative   Not on file   Social Determinants of Health   Financial Resource Strain: Not on file  Food Insecurity: Not on file  Transportation Needs: Not on file  Physical Activity: Not on file  Stress: Not on file  Social Connections: Not on file  Intimate Partner Violence: Not on file    ROS All review of systems negative except what is listed in the HPI      Objective    BP 111/65   Pulse 80   Ht 5\' 4"  (1.626 m)   Wt 175 lb (79.4 kg)   LMP 09/16/2022 (Exact Date)   SpO2 98%   BMI 30.04 kg/m   Physical Exam Vitals reviewed.  Constitutional:      Appearance: Normal appearance.  Cardiovascular:     Rate and Rhythm: Normal rate and regular rhythm.     Heart sounds: Normal heart sounds.  Pulmonary:     Effort: Pulmonary effort is normal.     Breath sounds: Normal breath sounds.  Skin:    General: Skin is warm and dry.  Neurological:     Mental Status: She is alert and oriented to person, place, and time.  Psychiatric:        Mood and Affect: Mood normal.        Behavior: Behavior normal.        Thought Content: Thought content normal.        Judgment: Judgment normal.         Assessment & Plan:   Problem List Items Addressed This Visit       Active Problems   Ovarian cyst    Multiple ovarian cysts in the past, with right oophorectomy in 2013. No current gynecological complaints. -Continue regular follow-up with OB/GYN, Dr. Dolan Amen.      Seasonal allergies - Primary    Symptoms worse in the spring. -Consider starting an allergy pill or Flonase around Valentine's Day to [facility].      Flexural eczema    Chronic condition, flares with stress and in creases of arms, neck, and wrists. Currently not bothersome. -Continue use of cortisone cream as needed and supportive measures.      Other Visit Diagnoses     Encounter for medical examination to establish care           Return if symptoms worsen or fail to improve, for  physical at your convenience.   Clayborne Dana, NP

## 2022-10-13 NOTE — Assessment & Plan Note (Signed)
Multiple ovarian cysts in the past, with right oophorectomy in 2013. No current gynecological complaints. -Continue regular follow-up with OB/GYN, Dr. Dolan Amen.

## 2022-10-13 NOTE — Assessment & Plan Note (Signed)
Symptoms worse in the spring. -Consider starting an allergy pill or Flonase around Valentine's Day to [facility].

## 2022-10-28 DIAGNOSIS — Z419 Encounter for procedure for purposes other than remedying health state, unspecified: Secondary | ICD-10-CM | POA: Diagnosis not present

## 2022-10-29 ENCOUNTER — Ambulatory Visit: Payer: Medicaid Other | Admitting: Obstetrics & Gynecology

## 2022-11-03 ENCOUNTER — Ambulatory Visit: Payer: Medicaid Other | Admitting: Obstetrics & Gynecology

## 2022-11-28 DIAGNOSIS — Z419 Encounter for procedure for purposes other than remedying health state, unspecified: Secondary | ICD-10-CM | POA: Diagnosis not present

## 2022-12-09 ENCOUNTER — Ambulatory Visit: Payer: Medicaid Other

## 2022-12-10 ENCOUNTER — Ambulatory Visit (INDEPENDENT_AMBULATORY_CARE_PROVIDER_SITE_OTHER): Payer: Medicaid Other | Admitting: Obstetrics & Gynecology

## 2022-12-10 ENCOUNTER — Other Ambulatory Visit (HOSPITAL_COMMUNITY)
Admission: RE | Admit: 2022-12-10 | Discharge: 2022-12-10 | Disposition: A | Discharge status: Home / Self Care | Payer: Medicaid Other | Source: Ambulatory Visit | Attending: Obstetrics & Gynecology | Admitting: Obstetrics & Gynecology

## 2022-12-10 ENCOUNTER — Encounter: Payer: Self-pay | Admitting: Obstetrics & Gynecology

## 2022-12-10 VITALS — BP 116/69 | HR 78 | Ht 64.0 in | Wt 177.0 lb

## 2022-12-10 DIAGNOSIS — Z01419 Encounter for gynecological examination (general) (routine) without abnormal findings: Secondary | ICD-10-CM | POA: Insufficient documentation

## 2022-12-10 DIAGNOSIS — Z1151 Encounter for screening for human papillomavirus (HPV): Secondary | ICD-10-CM | POA: Diagnosis not present

## 2022-12-10 NOTE — Progress Notes (Signed)
Subjective:     [name] is a 27 y.o. female here for a routine exam.  Current complaints:    Gynecologic History Patient's last menstrual period was 12/03/2022 (exact date). Contraception:  same sex relationship Last Pap: 10/29/2021. Results were: normal Last mammogram: n/a.   Obstetric History OB History  Gravida Para Term Preterm AB Living  2 1 1   1 1   SAB IAB Ectopic Multiple Live Births    1   0 1    # Outcome Date GA Lbr Len/2nd Weight Sex Type Anes PTL Lv  2 Term 05/09/16 [redacted]w[redacted]d 09:14 / 01:55 6 lb 14.1 oz (3.121 kg) M Vag-Spont EPI  LIV  1 IAB             The following portions of the patient's history were reviewed and updated as appropriate: allergies, current medications, past family history, past medical history, past social history, past surgical history, and problem list.  Review of Systems Pertinent items are noted in HPI.    Objective:  BP 116/69   Pulse 78   Ht 5\' 4"  (1.626 m)   Wt 177 lb (80.3 kg)   LMP 12/03/2022 (Exact Date)   BMI 30.38 kg/m   General Appearance:    Alert, cooperative, no distress, appears stated age  Head:    Normocephalic, without obvious abnormality, atraumatic  Eyes:    conjunctiva/corneas clear, EOM's intact, both eyes  Ears:    Normal external ear canals, both ears  Nose:   Nares normal, septum midline, mucosa normal, no drainage    or sinus tenderness  Throat:   Lips, mucosa, and tongue normal; teeth and gums normal  Neck:   Supple, symmetrical, trachea midline, no adenopathy;    thyroid:  no enlargement/tenderness/nodules  Back:     Symmetric, no curvature, ROM normal, no CVA tenderness  Lungs:     respirations unlabored  Chest Wall:    No tenderness or deformity   Heart:    Regular rate and rhythm  Breast Exam:    No tenderness, masses, or nipple abnormality  Abdomen:     Soft, non-tender, bowel sounds active all four quadrants,    no masses, no organomegaly  Genitalia:    Normal female without lesion, discharge or  tenderness     Extremities:   Extremities normal, atraumatic, no cyanosis or edema  Pulses:   2+ and symmetric all extremities  Skin:   Skin color, texture, turgor normal, no rashes or lesions     Assessment:    Healthy female exam.    Plan:  [name] was seen today for gynecologic exam.  Diagnoses and all orders for this visit:  Well female exam with routine gynecological exam -     Cytology - PAP( [facility])   F/u in 1 year or sooner prn   Clark Clowdus L. Harraway-Smith, M.D., [place]

## 2022-12-15 LAB — CYTOLOGY - PAP: Diagnosis: NEGATIVE

## 2022-12-28 DIAGNOSIS — Z419 Encounter for procedure for purposes other than remedying health state, unspecified: Secondary | ICD-10-CM | POA: Diagnosis not present

## 2023-01-28 DIAGNOSIS — Z419 Encounter for procedure for purposes other than remedying health state, unspecified: Secondary | ICD-10-CM | POA: Diagnosis not present

## 2023-02-28 DIAGNOSIS — Z419 Encounter for procedure for purposes other than remedying health state, unspecified: Secondary | ICD-10-CM | POA: Diagnosis not present

## 2023-03-28 DIAGNOSIS — Z419 Encounter for procedure for purposes other than remedying health state, unspecified: Secondary | ICD-10-CM | POA: Diagnosis not present

## 2023-05-09 DIAGNOSIS — Z419 Encounter for procedure for purposes other than remedying health state, unspecified: Secondary | ICD-10-CM | POA: Diagnosis not present

## 2023-06-08 DIAGNOSIS — Z419 Encounter for procedure for purposes other than remedying health state, unspecified: Secondary | ICD-10-CM | POA: Diagnosis not present

## 2023-07-09 DIAGNOSIS — Z419 Encounter for procedure for purposes other than remedying health state, unspecified: Secondary | ICD-10-CM | POA: Diagnosis not present

## 2023-08-08 DIAGNOSIS — Z419 Encounter for procedure for purposes other than remedying health state, unspecified: Secondary | ICD-10-CM | POA: Diagnosis not present

## 2023-09-08 DIAGNOSIS — Z419 Encounter for procedure for purposes other than remedying health state, unspecified: Secondary | ICD-10-CM | POA: Diagnosis not present

## 2023-10-09 DIAGNOSIS — Z419 Encounter for procedure for purposes other than remedying health state, unspecified: Secondary | ICD-10-CM | POA: Diagnosis not present
